# Patient Record
Sex: Female | Born: 1995 | Race: Black or African American | Hispanic: No | Smoking: Never smoker
Health system: Southern US, Community
[De-identification: ages and names within clinical notes are randomized; demographics above are authoritative.]

## PROBLEM LIST (undated history)

## (undated) DIAGNOSIS — D649 Anemia, unspecified: Secondary | ICD-10-CM

## (undated) DIAGNOSIS — Z789 Other specified health status: Secondary | ICD-10-CM

## (undated) DIAGNOSIS — F419 Anxiety disorder, unspecified: Secondary | ICD-10-CM

## (undated) HISTORY — DX: Anxiety disorder, unspecified: F41.9

## (undated) HISTORY — DX: Other specified health status: Z78.9

## (undated) HISTORY — DX: Anemia, unspecified: D64.9

## (undated) HISTORY — PX: NO PAST SURGERIES: SHX2092

---
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
## 2017-06-30 ENCOUNTER — Ambulatory Visit (INDEPENDENT_AMBULATORY_CARE_PROVIDER_SITE_OTHER): Payer: BLUE CROSS/BLUE SHIELD | Admitting: Family Medicine

## 2017-06-30 ENCOUNTER — Encounter: Payer: Self-pay | Admitting: Family Medicine

## 2017-06-30 VITALS — BP 110/70 | HR 77 | Resp 18 | Ht 66.0 in | Wt 108.0 lb

## 2017-06-30 DIAGNOSIS — Z113 Encounter for screening for infections with a predominantly sexual mode of transmission: Secondary | ICD-10-CM | POA: Diagnosis not present

## 2017-06-30 DIAGNOSIS — Z30011 Encounter for initial prescription of contraceptive pills: Secondary | ICD-10-CM

## 2017-06-30 DIAGNOSIS — Z01419 Encounter for gynecological examination (general) (routine) without abnormal findings: Secondary | ICD-10-CM

## 2017-06-30 HISTORY — DX: Encounter for initial prescription of contraceptive pills: Z30.011

## 2017-06-30 LAB — CBC WITH DIFFERENTIAL/PLATELET
BASOS ABS: 0 10*3/uL (ref 0.0–0.1)
Basophils Relative: 0.5 % (ref 0.0–3.0)
EOS PCT: 1.5 % (ref 0.0–5.0)
Eosinophils Absolute: 0 10*3/uL (ref 0.0–0.7)
HCT: 35.3 % — ABNORMAL LOW (ref 36.0–49.0)
HEMOGLOBIN: 11.9 g/dL — AB (ref 12.0–16.0)
LYMPHS ABS: 1.4 10*3/uL (ref 0.7–4.0)
LYMPHS PCT: 45.2 % (ref 24.0–48.0)
MCHC: 33.8 g/dL (ref 31.0–37.0)
MCV: 92.6 fl (ref 78.0–98.0)
MONOS PCT: 10.8 % (ref 3.0–12.0)
Monocytes Absolute: 0.3 10*3/uL (ref 0.1–1.0)
NEUTROS PCT: 42 % — AB (ref 43.0–71.0)
Neutro Abs: 1.3 10*3/uL — ABNORMAL LOW (ref 1.4–7.7)
Platelets: 182 10*3/uL (ref 150.0–575.0)
RBC: 3.82 Mil/uL (ref 3.80–5.70)
RDW: 12.8 % (ref 11.4–15.5)
WBC: 3 10*3/uL — ABNORMAL LOW (ref 4.5–13.5)

## 2017-06-30 LAB — COMPREHENSIVE METABOLIC PANEL
ALBUMIN: 4.3 g/dL (ref 3.5–5.2)
ALT: 14 U/L (ref 0–35)
AST: 20 U/L (ref 0–37)
Alkaline Phosphatase: 54 U/L (ref 47–119)
BUN: 8 mg/dL (ref 6–23)
CHLORIDE: 104 meq/L (ref 96–112)
CO2: 27 mEq/L (ref 19–32)
Calcium: 9.4 mg/dL (ref 8.4–10.5)
Creatinine, Ser: 0.66 mg/dL (ref 0.40–1.20)
GFR: 148.46 mL/min (ref >60.00)
Glucose, Bld: 88 mg/dL (ref 70–99)
POTASSIUM: 3.9 meq/L (ref 3.5–5.1)
SODIUM: 136 meq/L (ref 135–145)
Total Bilirubin: 0.5 mg/dL (ref 0.3–1.2)
Total Protein: 7.2 g/dL (ref 6.0–8.3)

## 2017-06-30 LAB — TSH: TSH: 1.47 u[IU]/mL (ref 0.40–5.00)

## 2017-06-30 LAB — LIPID PANEL
Cholesterol: 114 mg/dL (ref 0–200)
HDL: 51.8 mg/dL
LDL Cholesterol: 58 mg/dL (ref 0–99)
NonHDL: 62.64
Total CHOL/HDL Ratio: 2
Triglycerides: 23 mg/dL (ref 0.0–149.0)
VLDL: 4.6 mg/dL (ref 0.0–40.0)

## 2017-06-30 NOTE — Patient Instructions (Signed)
Great to see you.  We will call you with your lab results or you can view them online. 

## 2017-06-30 NOTE — Progress Notes (Signed)
Subjective:   Patient ID: Melissa Farmer, female    DOB: 09/03/1996, 21 y.o.   MRN: [id number]  Melissa Farmer is a pleasant 21 y.o. year old female who presents to clinic today with Annual Exam  on 06/30/2017  HPI:  Doing well.  Unfortunately she will need to transfer to a new school.  Her program in [place] is closing down.  She is working.  Sexually active with her boyfriend of 8 months.  Using condoms.  Does not want to restart depo- felt her periods were too irregular and often heavier with it.  Current Outpatient Prescriptions on File Prior to Visit  Medication Sig Dispense Refill  . ibuprofen (ADVIL,MOTRIN) 600 MG tablet Take 1 tablet (600 mg total) by mouth every 8 (eight) hours as needed. 30 tablet 0   No current facility-administered medications on file prior to visit.     No Known Allergies  No past medical history on file.  No past surgical history on file.  Family History  Problem Relation Age of Onset  . Asthma Father   . Kidney disease Maternal Grandmother   . Stroke Maternal Grandfather   . Diabetes Maternal Grandfather   . Heart disease Maternal Grandfather   . Cancer Paternal Grandmother     Social History   Social History  . Marital status: Single    Spouse name: N/A  . Number of children: N/A  . Years of education: N/A   Occupational History  . Not on file.   Social History Main Topics  . Smoking status: Never Smoker  . Smokeless tobacco: Never Used  . Alcohol use No  . Drug use: No  . Sexual activity: No   Other Topics Concern  . 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   The PMH, PSH, Social History, Family History, Medications, and allergies have been reviewed in [facility], and have been updated if relevant.   Review of Systems  Constitutional: Negative.   HENT: Negative.   Eyes: Negative.   Respiratory: Negative.   Cardiovascular: Negative.   Gastrointestinal:  Negative.   Endocrine: Negative.   Genitourinary: Negative.   Musculoskeletal: Negative.   Allergic/Immunologic: Negative.   Neurological: Negative.   Hematological: Negative.   Psychiatric/Behavioral: Negative.   All other systems reviewed and are negative.      Objective:    BP 110/70   Pulse 77   Resp 18   Ht 5\' 6"  (1.676 m)   Wt 108 lb (49 kg)   SpO2 99%   BMI 17.43 kg/m    Physical Exam   General:  Well-developed,well-nourished,in no acute distress; alert,appropriate and cooperative throughout examination Head:  normocephalic and atraumatic.   Eyes:  vision grossly intact, PERRL Ears:  R ear normal and L ear normal externally, TMs clear bilaterally Nose:  no external deformity.   Mouth:  good dentition.   Neck:  No deformities, masses, or tenderness noted. Breasts:  No mass, nodules, thickening, tenderness, bulging, retraction, inflamation, nipple discharge or skin changes noted.   Lungs:  Normal respiratory effort, chest expands symmetrically. Lungs are clear to auscultation, no crackles or wheezes. Heart:  Normal rate and regular rhythm. S1 and S2 normal without gallop, murmur, click, rub or other extra sounds. Abdomen:  Bowel sounds positive,abdomen soft and non-tender without masses, organomegaly or hernias noted. Msk:  No deformity or scoliosis noted of thoracic or lumbar spine.   Extremities:  No  clubbing, cyanosis, edema, or deformity noted with normal full range of motion of all joints.   Neurologic:  alert & oriented X3 and gait normal.   Skin:  Intact without suspicious lesions or rashes Cervical Nodes:  No lymphadenopathy noted Axillary Nodes:  No palpable lymphadenopathy Psych:  Cognition and judgment appear intact. Alert and cooperative with normal attention span and concentration. No apparent delusions, illusions, hallucinations       Assessment & Plan:   Well woman exam - Plan: CBC with Differential/Platelet, Comprehensive metabolic panel, Lipid  panel, TSH  Screening examination for STD (sexually transmitted disease) - Plan: HIV antibody (with reflex), RPR No Follow-up on file.

## 2017-06-30 NOTE — Assessment & Plan Note (Signed)
Reviewed preventive care protocols, scheduled due services, and updated immunizations Discussed nutrition, exercise, diet, and healthy lifestyle.  Orders Placed This Encounter  Procedures  . CBC with Differential/Platelet  . Comprehensive metabolic panel  . Lipid panel  . TSH  . HIV antibody (with reflex)  . RPR    

## 2017-07-01 ENCOUNTER — Telehealth: Payer: Self-pay | Admitting: Family Medicine

## 2017-07-01 DIAGNOSIS — R7989 Other specified abnormal findings of blood chemistry: Secondary | ICD-10-CM

## 2017-07-01 LAB — HIV ANTIBODY (ROUTINE TESTING W REFLEX): HIV 1&2 Ab, 4th Generation: NONREACTIVE

## 2017-07-01 LAB — RPR

## 2017-07-01 NOTE — Telephone Encounter (Signed)
Patient said her migraine headaches are worse.  She said she had a migraine headache yesterday and it lasted until she went to bed after work.  The migraine headache came back this morning. Patient wants to know what Dr.Aron suggests she do for the headaches.  Patient uses [facility].

## 2017-07-01 NOTE — Telephone Encounter (Signed)
Spoke with patient while she was driving & informed per Dr Dayton MartesAron: As long as she is not taking it all the time, she can take Alleve, Motrin, or excedrin migraine.  Always with food and never daily because that can lead to rebound/medication overuse headaches. I would also like to repeat her CBC in 3 months.

## 2017-07-01 NOTE — Telephone Encounter (Signed)
As long as she is not taking it all the time, she can take Alleve, Motrin, or excedrin migraine.  Always with food and never daily because that can lead to rebound/medication overuse headaches. I would also like to repeat her CBC in 3 months.

## 2017-07-01 NOTE — Telephone Encounter (Signed)
Patient returned Melissa Farmer's call about her lab results.  I let patient know her lab results.  Patient said she doesn't have heavy periods.

## 2018-02-02 ENCOUNTER — Encounter: Payer: Self-pay | Admitting: *Deleted

## 2018-02-08 ENCOUNTER — Ambulatory Visit: Payer: BLUE CROSS/BLUE SHIELD | Admitting: Family Medicine

## 2018-03-04 ENCOUNTER — Encounter: Payer: Self-pay | Admitting: Student

## 2018-03-04 ENCOUNTER — Ambulatory Visit (INDEPENDENT_AMBULATORY_CARE_PROVIDER_SITE_OTHER): Payer: BLUE CROSS/BLUE SHIELD | Admitting: Student

## 2018-03-04 VITALS — BP 122/74 | HR 90 | Wt 110.7 lb

## 2018-03-04 DIAGNOSIS — Z3400 Encounter for supervision of normal first pregnancy, unspecified trimester: Secondary | ICD-10-CM | POA: Insufficient documentation

## 2018-03-04 DIAGNOSIS — Z113 Encounter for screening for infections with a predominantly sexual mode of transmission: Secondary | ICD-10-CM | POA: Diagnosis not present

## 2018-03-04 DIAGNOSIS — Z3401 Encounter for supervision of normal first pregnancy, first trimester: Secondary | ICD-10-CM

## 2018-03-04 LAB — POCT URINALYSIS DIP (DEVICE)
Bilirubin Urine: NEGATIVE
GLUCOSE, UA: NEGATIVE mg/dL
HGB URINE DIPSTICK: NEGATIVE
Ketones, ur: NEGATIVE mg/dL
Leukocytes, UA: NEGATIVE
Nitrite: NEGATIVE
PROTEIN: NEGATIVE mg/dL
SPECIFIC GRAVITY, URINE: 1.02 (ref 1.005–1.030)
Urobilinogen, UA: 0.2 mg/dL (ref 0.0–1.0)
pH: 6.5 (ref 5.0–8.0)

## 2018-03-04 MED ORDER — PREPLUS 27-1 MG PO TABS: 1.0000 | ORAL_TABLET | Freq: Every day | ORAL | 12 refills | Status: DC

## 2018-03-04 NOTE — Progress Notes (Signed)
Here for initial prenatal visit. Given new patient packet. Has started sign up for MyChart. Signed up for Babys[employer]s optimization. Declined flu today, may get next visit.

## 2018-03-04 NOTE — Progress Notes (Signed)
  Subjective:    Melissa Farmer is being seen today for her first obstetrical visit.  This is not a planned pregnancy. She is at 4828w6d gestation. Her obstetrical history is significant for nothing. . Relationship with FOB: significant other, living together. Patient does intend to breast feed. Pregnancy history fully reviewed.  Patient reports no complaints.  Review of Systems:   Review of Systems  Constitutional: Negative.   HENT: Negative.   Respiratory: Negative.   Cardiovascular: Negative.   Gastrointestinal: Negative.   Genitourinary: Negative.   Musculoskeletal: Negative.   Neurological: Negative.   Hematological: Negative.   Psychiatric/Behavioral: Negative.     Objective:     BP 122/74   Pulse 90   Wt 110 lb 11.2 oz (50.2 kg)   LMP 12/18/2017 (Exact Date)   BMI 17.87 kg/m  Physical Exam  Constitutional: She appears well-developed.  HENT:  Head: Normocephalic.  Eyes: Pupils are equal, round, and reactive to light.  Neck: Normal range of motion.  Cardiovascular: Normal rate.  Respiratory: Effort normal.  GI: Soft.  Musculoskeletal: Normal range of motion.  Neurological: She is alert.  Skin: Skin is warm.  Psychiatric: She has a normal mood and affect.    Exam    Assessment:    Pregnancy: G1P0 Patient Active Problem List   Diagnosis Date Noted  . Supervision of normal first pregnancy, antepartum 03/04/2018  . Well woman exam 06/30/2017  . Bacterial vaginitis 06/27/2016  . Menometrorrhagia 06/27/2016  . Headache 03/26/2016  . Contraceptive management 10/31/2014       Plan:     Initial labs drawn. Prenatal vitamins. Problem list reviewed and updated. AFP3 discussed: will do NIPS. Marland Kitchen. Role of ultrasound in pregnancy discussed; fetal survey: ordered. Amniocentesis discussed: declined. Follow up in 8 weeks. 50% of 30 min visit spent on counseling and coordination of care.  -Patient doing well; no complaints.  -Welcomed and oriented patient  to practice -[place] [facility] -Pap not done (too young) -Return in 8 weeks   Charlesetta GaribaldiKathryn Lorraine [facility] 03/04/2018

## 2018-03-04 NOTE — Progress Notes (Signed)
PMH form completed

## 2018-03-04 NOTE — Patient Instructions (Signed)

## 2018-03-05 DIAGNOSIS — Z3400 Encounter for supervision of normal first pregnancy, unspecified trimester: Secondary | ICD-10-CM

## 2018-03-05 LAB — GC/CHLAMYDIA PROBE AMP (~~LOC~~) NOT AT ARMC
CHLAMYDIA, DNA PROBE: NEGATIVE
Neisseria Gonorrhea: NEGATIVE

## 2018-03-06 LAB — CULTURE, OB URINE

## 2018-03-06 LAB — URINE CULTURE, OB REFLEX

## 2018-03-08 ENCOUNTER — Encounter: Payer: Self-pay | Admitting: *Deleted

## 2018-03-11 ENCOUNTER — Encounter: Payer: Self-pay | Admitting: Student

## 2018-03-12 LAB — OBSTETRIC PANEL, INCLUDING HIV
Antibody Screen: NEGATIVE
BASOS ABS: 0 10*3/uL (ref 0.0–0.2)
Basos: 0 %
EOS (ABSOLUTE): 0 10*3/uL (ref 0.0–0.4)
Eos: 1 %
HIV SCREEN 4TH GENERATION: NONREACTIVE
Hematocrit: 33.6 % — ABNORMAL LOW (ref 34.0–46.6)
Hemoglobin: 11.4 g/dL (ref 11.1–15.9)
Hepatitis B Surface Ag: NEGATIVE
IMMATURE GRANULOCYTES: 1 %
Immature Grans (Abs): 0 10*3/uL (ref 0.0–0.1)
LYMPHS ABS: 1.2 10*3/uL (ref 0.7–3.1)
Lymphs: 21 %
MCH: 30.8 pg (ref 26.6–33.0)
MCHC: 33.9 g/dL (ref 31.5–35.7)
MCV: 91 fL (ref 79–97)
MONOS ABS: 0.5 10*3/uL (ref 0.1–0.9)
Monocytes: 9 %
NEUTROS ABS: 3.8 10*3/uL (ref 1.4–7.0)
NEUTROS PCT: 68 %
PLATELETS: 311 10*3/uL (ref 150–379)
RBC: 3.7 x10E6/uL — AB (ref 3.77–5.28)
RDW: 14.4 % (ref 12.3–15.4)
RPR: NONREACTIVE
Rh Factor: POSITIVE
Rubella Antibodies, IGG: 2.25 index (ref >0.99)
WBC: 5.5 10*3/uL (ref 3.4–10.8)

## 2018-03-12 LAB — SMN1 COPY NUMBER ANALYSIS (SMA CARRIER SCREENING)

## 2018-03-12 LAB — HEMOGLOBINOPATHY EVALUATION
Ferritin: 30 ng/mL (ref 15–77)
HGB A2 QUANT: 2.2 % (ref 1.8–3.2)
HGB A: 97.8 % (ref 96.4–98.8)
HGB C: 0 %
HGB VARIANT: 0 %
Hgb F Quant: 0 % (ref 0.0–2.0)
Hgb S: 0 %
Hgb Solubility: NEGATIVE

## 2018-03-18 ENCOUNTER — Encounter: Payer: Self-pay | Admitting: *Deleted

## 2018-03-19 ENCOUNTER — Encounter: Payer: BLUE CROSS/BLUE SHIELD | Admitting: Student

## 2018-03-24 ENCOUNTER — Encounter: Payer: Self-pay | Admitting: *Deleted

## 2018-04-09 ENCOUNTER — Telehealth: Payer: Self-pay | Admitting: General Practice

## 2018-04-09 NOTE — Telephone Encounter (Signed)
Received phone call from [facility] with an elevated BP. Patient's BP was 140/67 yesterday and patient reported headaches. Called patient and she states when she took her BP she had been running around all day and just walked in the door after carrying groceries in before she check her BP. Patient reports history of migraines but has noticed more headaches recently. Patient attributes this to wisdom teeth coming in. Patient denies blurry vision or dizziness. Patient states she has rechecked her BP today and it is 116/66. Reviewed with Dr Debroah Loop, who states BP is fine for now, will continue to monitor. Patient's next OB visit is 6/10. Discussed with patient and reviewed proper way to check BP. Patient verbalized understanding & had no questions.

## 2018-04-11 ENCOUNTER — Encounter: Payer: Self-pay | Admitting: Student

## 2018-04-14 ENCOUNTER — Encounter: Payer: Self-pay | Admitting: Student

## 2018-04-21 ENCOUNTER — Telehealth: Payer: Self-pay | Admitting: Licensed Clinical Social Worker

## 2018-04-21 ENCOUNTER — Encounter (HOSPITAL_COMMUNITY): Payer: Self-pay

## 2018-04-21 NOTE — Telephone Encounter (Signed)
CSW A. Aliviana Burdell contacted pt regarding scheduled visit. Pt confirmed.  

## 2018-04-26 ENCOUNTER — Ambulatory Visit (INDEPENDENT_AMBULATORY_CARE_PROVIDER_SITE_OTHER): Payer: BLUE CROSS/BLUE SHIELD | Admitting: Advanced Practice Midwife

## 2018-04-26 VITALS — BP 116/70 | HR 97 | Wt 113.8 lb

## 2018-04-26 DIAGNOSIS — Z3402 Encounter for supervision of normal first pregnancy, second trimester: Secondary | ICD-10-CM

## 2018-04-26 NOTE — Progress Notes (Signed)
   PRENATAL VISIT NOTE  Subjective:  Melissa Farmer is a 22 y.o. G1P0 at 859w3d being seen today for ongoing prenatal care.  She is currently monitored for the following issues for this low-risk pregnancy and has Contraceptive management; Headache; Bacterial vaginitis; Menometrorrhagia; Well woman exam; and Supervision of normal first pregnancy, antepartum on their problem list.  Patient reports no complaints.  Contractions: Not present. Vag. Bleeding: None.  Movement: Present. Denies leaking of fluid.   The following portions of the patient's history were reviewed and updated as appropriate: allergies, current medications, past family history, past medical history, past social history, past surgical history and problem list. Problem list updated.  Objective:   Vitals:   04/26/18 1035  BP: 116/70  Pulse: 97  Weight: 113 lb 12.8 oz (51.6 kg)    Fetal Status: Fetal Heart Rate (bpm): 152   Movement: Present     General:  Alert, oriented and cooperative. Patient is in no acute distress.  Skin: Skin is warm and dry. No rash noted.   Cardiovascular: Normal heart rate noted  Respiratory: Normal respiratory effort, no problems with respiration noted  Abdomen: Soft, gravid, appropriate for gestational age.  Pain/Pressure: Absent     Pelvic: Cervical exam deferred        Extremities: Normal range of motion.  Edema: None  Mental Status: Normal mood and affect. Normal behavior. Normal judgment and thought content.   Assessment and Plan:  Pregnancy: G1P0 at 5659w3d  1. Encounter for supervision of normal first pregnancy in second trimester --Excited to hear heartbeat today, feeling well --MSAFP today --Next visit in about 9 weeks, fasting for two hour GTT   Preterm labor symptoms and general obstetric precautions including but not limited to vaginal bleeding, contractions, leaking of fluid and fetal movement were reviewed in detail with the patient. Please refer to After Visit Summary  for other counseling recommendations.  Return in about 2 months (around 06/28/2018).  Future Appointments  Date Time Provider Department Center  04/30/2018 12:45 PM [facility] US 2 [facility]    Calvert CantorSamantha C Welford Christmas, CNM  04/26/18  10:50 AM

## 2018-04-26 NOTE — Patient Instructions (Signed)
Second Trimester of Pregnancy The second trimester is from week 13 through week 28, month 4 through 6. This is often the time in pregnancy that you feel your best. Often times, morning sickness has lessened or quit. You may have more energy, and you may get hungry more often. Your unborn baby (fetus) is growing rapidly. At the end of the sixth month, he or she is about 9 inches long and weighs about 1 pounds. You will likely feel the baby move (quickening) between 18 and 20 weeks of pregnancy. Follow these instructions at home:  Avoid all smoking, herbs, and alcohol. Avoid drugs not approved by your doctor.  Do not use any tobacco products, including cigarettes, chewing tobacco, and electronic cigarettes. If you need help quitting, ask your doctor. You may get counseling or other support to help you quit.  Only take medicine as told by your doctor. Some medicines are safe and some are not during pregnancy.  Exercise only as told by your doctor. Stop exercising if you start having cramps.  Eat regular, healthy meals.  Wear a good support bra if your breasts are tender.  Do not use hot tubs, steam rooms, or saunas.  Wear your seat belt when driving.  Avoid raw meat, uncooked cheese, and liter boxes and soil used by cats.  Take your prenatal vitamins.  Take 1500-2000 milligrams of calcium daily starting at the 20th week of pregnancy until you deliver your baby.  Try taking medicine that helps you poop (stool softener) as needed, and if your doctor approves. Eat more fiber by eating fresh fruit, vegetables, and whole grains. Drink enough fluids to keep your pee (urine) clear or pale yellow.  Take warm water baths (sitz baths) to soothe pain or discomfort caused by hemorrhoids. Use hemorrhoid cream if your doctor approves.  If you have puffy, bulging veins (varicose veins), wear support hose. Raise (elevate) your feet for 15 minutes, 3-4 times a day. Limit salt in your diet.  Avoid heavy  lifting, wear low heals, and sit up straight.  Rest with your legs raised if you have leg cramps or low back pain.  Visit your dentist if you have not gone during your pregnancy. Use a soft toothbrush to brush your teeth. Be gentle when you floss.  You can have sex (intercourse) unless your doctor tells you not to.  Go to your doctor visits. Get help if:  You feel dizzy.  You have mild cramps or pressure in your lower belly (abdomen).  You have a nagging pain in your belly area.  You continue to feel sick to your stomach (nauseous), throw up (vomit), or have watery poop (diarrhea).  You have bad smelling fluid coming from your vagina.  You have pain with peeing (urination). Get help right away if:  You have a fever.  You are leaking fluid from your vagina.  You have spotting or bleeding from your vagina.  You have severe belly cramping or pain.  You lose or gain weight rapidly.  You have trouble catching your breath and have chest pain.  You notice sudden or extreme puffiness (swelling) of your face, hands, ankles, feet, or legs.  You have not felt the baby move in over an hour.  You have severe headaches that do not go away with medicine.  You have vision changes. This information is not intended to replace advice given to you by your health care provider. Make sure you discuss any questions you have with your health care   provider. Document Released: 01/28/2010 Document Revised: 04/10/2016 Document Reviewed: 01/04/2013 Elsevier Interactive Patient Education  2017 [employer] Inc.  

## 2018-04-28 LAB — AFP, SERUM, OPEN SPINA BIFIDA
AFP MOM: 0.61
AFP Value: 36.1 ng/mL
Gest. Age on Collection Date: 18.3 weeks
Maternal Age At EDD: 20.1 yr
OSBR RISK 1 IN: 10000
TEST RESULTS AFP: NEGATIVE
WEIGHT: 113 [lb_av]

## 2018-04-30 ENCOUNTER — Ambulatory Visit (HOSPITAL_COMMUNITY)
Admission: RE | Admit: 2018-04-30 | Discharge: 2018-04-30 | Disposition: A | Discharge status: Home / Self Care | Payer: BLUE CROSS/BLUE SHIELD | Source: Ambulatory Visit | Attending: Student | Admitting: Student

## 2018-04-30 DIAGNOSIS — Z363 Encounter for antenatal screening for malformations: Secondary | ICD-10-CM | POA: Insufficient documentation

## 2018-04-30 DIAGNOSIS — Z3402 Encounter for supervision of normal first pregnancy, second trimester: Secondary | ICD-10-CM | POA: Insufficient documentation

## 2018-04-30 DIAGNOSIS — Z3400 Encounter for supervision of normal first pregnancy, unspecified trimester: Secondary | ICD-10-CM

## 2018-05-04 ENCOUNTER — Encounter: Payer: Self-pay | Admitting: Student

## 2018-05-04 DIAGNOSIS — O43112 Circumvallate placenta, second trimester: Secondary | ICD-10-CM | POA: Insufficient documentation

## 2018-05-08 ENCOUNTER — Encounter: Payer: Self-pay | Admitting: Student

## 2018-05-10 ENCOUNTER — Encounter: Payer: Self-pay | Admitting: Student

## 2018-05-13 ENCOUNTER — Encounter: Payer: Self-pay | Admitting: Student

## 2018-06-02 ENCOUNTER — Encounter: Payer: Self-pay | Admitting: Student

## 2018-06-08 ENCOUNTER — Encounter: Payer: Self-pay | Admitting: Advanced Practice Midwife

## 2018-06-14 ENCOUNTER — Encounter (HOSPITAL_COMMUNITY): Payer: Self-pay | Admitting: Emergency Medicine

## 2018-06-14 ENCOUNTER — Ambulatory Visit (HOSPITAL_COMMUNITY)
Admission: EM | Admit: 2018-06-14 | Discharge: 2018-06-14 | Disposition: A | Discharge status: Home / Self Care | Payer: BLUE CROSS/BLUE SHIELD | Attending: Family Medicine | Admitting: Family Medicine

## 2018-06-14 ENCOUNTER — Encounter: Payer: Self-pay | Admitting: Student

## 2018-06-14 DIAGNOSIS — R0789 Other chest pain: Secondary | ICD-10-CM | POA: Diagnosis not present

## 2018-06-14 NOTE — Discharge Instructions (Signed)
Tylenol for pain Rest tonight Return if worse

## 2018-06-14 NOTE — ED Provider Notes (Signed)
[facility]    CSN: [id number] Arrival date & time: 06/14/18  1655     History   Chief Complaint No chief complaint on file.   HPI Melissa Farmer is a 22 y.o. female.   HPI  Patient states that today only she is been experiencing chest pain.  She states it feels like a "pressure" in the center of her chest.  It comes and goes.  It is worse when she is standing and better when she sitting.  She also has some achiness and tingling in both arms.  No history of heart disease, hypertension, diabetes.  She is currently expecting, pregnancy is going well.  No leg pain, no history of clots.  No history of asthma or underlying lung disease.  No history of underlying heart disease or murmur.  No fever chills or recent illness, no cough.  No change in activity or heavy lifting.  History reviewed. No pertinent past medical history.  Patient Active Problem List   Diagnosis Date Noted  . 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 Supervision of normal first pregnancy, antepartum 03/04/2018  . Well woman exam 06/30/2017  . Bacterial vaginitis 06/27/2016  . Menometrorrhagia 06/27/2016  . Headache 03/26/2016  . Contraceptive management 10/31/2014    History reviewed. No pertinent surgical history.  OB History    Gravida  1   Para      Term      Preterm      AB      Living        SAB      TAB      Ectopic      Multiple      Live Births               Home Medications    Prior to Admission medications   Medication Sig Start Date End Date Taking? Authorizing Provider  Prenatal Vit-Fe Fumarate-FA (PREPLUS) 27-1 MG TABS Take 1 tablet by mouth daily. 03/04/18  Yes Marylene LandKooistra, Kathryn Lorraine, CNM  ibuprofen (ADVIL,MOTRIN) 600 MG tablet Take 1 tablet (600 mg total) by mouth every 8 (eight) hours as needed. 03/26/16   Dianne DunAron, Talia M, MD    Family History Family History  Problem Relation Age of Onset  . Kidney  disease Maternal Grandmother   . Stroke Maternal Grandfather   . Diabetes Maternal Grandfather   . Heart disease Maternal Grandfather   . Cancer Paternal Grandmother   . Asthma Father     Social History Social History   Tobacco Use  . Smoking status: Never Smoker  . Smokeless tobacco: Never Used  Substance Use Topics  . Alcohol use: No    Alcohol/week: 0.0 oz  . Drug use: No     Allergies   Patient has no known allergies.   Review of Systems Review of Systems  Constitutional: Negative for chills and fever.  HENT: Negative for ear pain and sore throat.   Eyes: Negative for pain and visual disturbance.  Respiratory: Positive for chest tightness. Negative for cough and shortness of breath.   Cardiovascular: Negative for chest pain and palpitations.  Gastrointestinal: Negative for abdominal pain and vomiting.  Genitourinary: Negative for dysuria and hematuria.  Musculoskeletal: Negative for arthralgias and back pain.  Skin: Negative for color change and rash.  Neurological: Positive for weakness and numbness. Negative for seizures and syncope.  All other systems reviewed and are negative.    Physical Exam Triage Vital Signs  ED Triage Vitals  Enc Vitals Group     BP 06/14/18 1749 123/79     Pulse Rate 06/14/18 1749 85     Resp 06/14/18 1749 16     Temp 06/14/18 1749 98.1 F (36.7 C)     Temp Source 06/14/18 1749 Oral     SpO2 06/14/18 1749 100 %     Weight 06/14/18 1750 125 lb (56.7 kg)     Height --      Head Circumference --      Peak Flow --      Pain Score 06/14/18 1750 6     Pain Loc --      Pain Edu? --      Excl. in GC? --    No data found.  Updated Vital Signs BP 123/79   Pulse 85   Temp 98.1 F (36.7 C) (Oral)   Resp 16   Wt 125 lb (56.7 kg)   LMP 12/18/2017 (Exact Date)   SpO2 100%   BMI 20.18 kg/m   Visual Acuity Right Eye Distance:   Left Eye Distance:   Bilateral Distance:    Right Eye Near:   Left Eye Near:    Bilateral  Near:     Physical Exam  Constitutional: She appears well-developed and well-nourished. No distress.  HENT:  Head: Normocephalic and atraumatic.  Right Ear: External ear normal.  Left Ear: External ear normal.  Mouth/Throat: Oropharynx is clear and moist.  Eyes: Pupils are equal, round, and reactive to light. Conjunctivae are normal.  Neck: Normal range of motion.  Cardiovascular: Normal rate and regular rhythm.  Murmur heard. Pulmonary/Chest: Effort normal and breath sounds normal. No respiratory distress.  Abdominal: Soft. She exhibits no distension.  Musculoskeletal: Normal range of motion. She exhibits no edema.  Lymphadenopathy:    She has cervical adenopathy.  Neurological: She is alert.  Skin: Skin is warm and dry.  Psychiatric: She has a normal mood and affect. Her behavior is normal.   No chest wall tenderness to palpation.  No abdominal tenderness to palpation.  UC Treatments / Results  Labs (all labs ordered are listed, but only abnormal results are displayed) Labs Reviewed - No data to display  EKG EKG is normal.  Normal sinus rhythm.  No ST changes. None  Radiology No results found.  Procedures Procedures (including critical care time)  Medications Ordered in UC Medications - No data to display  Initial Impression / Assessment and Plan / UC Course  I have reviewed the triage vital signs and the nursing notes.  Pertinent labs & imaging results that were available during my care of the patient were reviewed by me and considered in my medical decision making (see chart for details).     Discussed with patient with a normal EKG, normal physical examination I feel she can go home and take some Tylenol.  She gets worsening chest pain, shortness of breath, or fever she needs to come back or go to the emergency room.  Follow-up with OB as scheduled Final Clinical Impressions(s) / UC Diagnoses   Final diagnoses:  Feeling of chest tightness     Discharge  Instructions     Tylenol for pain Rest tonight Return if worse    ED Prescriptions    None     Controlled Substance Prescriptions [place] Controlled Substance Registry consulted? Not Applicable   [name], MD 06/14/18 2002

## 2018-06-14 NOTE — ED Triage Notes (Addendum)
PT reports intermittent chest pressure today. No medical history. PT is 5 months pregnant.

## 2018-06-23 ENCOUNTER — Other Ambulatory Visit: Payer: Self-pay | Admitting: *Deleted

## 2018-06-23 DIAGNOSIS — Z3400 Encounter for supervision of normal first pregnancy, unspecified trimester: Secondary | ICD-10-CM

## 2018-06-28 ENCOUNTER — Ambulatory Visit (INDEPENDENT_AMBULATORY_CARE_PROVIDER_SITE_OTHER): Payer: BLUE CROSS/BLUE SHIELD | Admitting: Advanced Practice Midwife

## 2018-06-28 ENCOUNTER — Other Ambulatory Visit: Payer: BLUE CROSS/BLUE SHIELD

## 2018-06-28 ENCOUNTER — Encounter: Payer: Self-pay | Admitting: Advanced Practice Midwife

## 2018-06-28 VITALS — BP 133/81 | HR 79 | Wt 129.0 lb

## 2018-06-28 DIAGNOSIS — Z3402 Encounter for supervision of normal first pregnancy, second trimester: Secondary | ICD-10-CM

## 2018-06-28 DIAGNOSIS — Z3400 Encounter for supervision of normal first pregnancy, unspecified trimester: Secondary | ICD-10-CM

## 2018-06-28 DIAGNOSIS — Z23 Encounter for immunization: Secondary | ICD-10-CM

## 2018-06-28 NOTE — Patient Instructions (Addendum)
Considering Waterbirth? Guide for patients at [facility]  Why consider waterbirth?  . Gentle birth for babies . Less pain medicine used in labor . May allow for passive descent/less pushing . May reduce perineal tears  . More mobility and instinctive maternal position changes . Increased maternal relaxation . Reduced blood pressure in labor  Is waterbirth safe? What are the risks of infection, drowning or other complications?  . Infection: o Very low risk (3.7 % for tub vs 4.8% for bed) o 7 in 8000 waterbirths with documented infection o Poorly cleaned equipment most common cause o Slightly lower group B strep transmission rate  . Drowning o Maternal:  - Very low risk   - Related to seizures or fainting o Newborn:  - Very low risk. No evidence of increased risk of respiratory problems in multiple large studies - Physiological protection from breathing under water - Avoid underwater birth if there are any fetal complications - Once baby's head is out of the water, keep it out.  . Birth complication o Some reports of cord trauma, but risk decreased by bringing baby to surface gradually o No evidence of increased risk of shoulder dystocia. Mothers can usually change positions faster in water than in a bed, possibly aiding the maneuvers to free the shoulder.   You must attend a Waterbirth class at [facility]  3rd Wednesday of every month from 7-9pm  Free  Register by calling [phone] or online at [email or web address]  Bring us the certificate from the class to your prenatal appointment  Meet with a midwife at 36 weeks to see if you can still plan a waterbirth and to sign the consent.   Purchase or rent the following supplies:   Water Birth Pool (Birth Pool in a Box or [place] for instance)  (Tubs start ~$125)  Single-use disposable tub liner designed for your brand of tub  New garden hose labeled "lead-free", "suitable for drinking  water",  Electric drain pump to remove water (We recommend 792 gallon per hour or greater pump.)   Separate garden hose to remove the dirty water  Fish net  Bathing suit top (optional)  Long-handled mirror (optional)  Places to purchase or rent supplies  Yourwaterbirth.com for tub purchases and supplies  Waterbirthsolutions.com for tub purchases and supplies  [employer] (www.thelaborladies.com) $275 for tub rental/set-up & take down/kit   [place] [employer] (http://www.padanc.org/MeetUs.htm) Information regarding doulas (labor support) who provide pool rentals  Our practice has a Birth Pool in a Box tub at the hospital that you may borrow on a first-come-first-served basis. It is your responsibility to to set up, clean and break down the tub. We cannot guarantee the availability of this tub in advance. You are responsible for bringing all accessories listed above. If you do not have all necessary supplies you cannot have a waterbirth.    Things that would prevent you from having a waterbirth:  Premature, <37wks  Previous cesarean birth  Presence of thick meconium-stained fluid  Multiple gestation (Twins, triplets, etc.)  Uncontrolled diabetes or gestational diabetes requiring medication  Hypertension requiring medication or diagnosis of pre-eclampsia  Heavy vaginal bleeding  Non-reassuring fetal heart rate  Active infection (MRSA, etc.). Group B Strep is NOT a contraindication for  waterbirth.  If your labor has to be induced and induction method requires continuous  monitoring of the baby's heart rate  Other risks/issues identified by your obstetrical provider  Please remember that birth is unpredictable. Under certain unforeseeable   circumstances your provider may advise against giving birth in the tub. These decisions will be made on a case-by-case basis and with the safety of you and your baby as our highest priority.    

## 2018-06-28 NOTE — Progress Notes (Signed)
   PRENATAL VISIT NOTE  Subjective:  Melissa Farmer is a 22 y.o. G1P0 at 3480w3d being seen today for ongoing prenatal care.  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  Patient reports no complaints.  Contractions: Not present. Vag. Bleeding: None.  Movement: Present. Denies leaking of fluid.   The following portions of the patient's history were reviewed and updated as appropriate: allergies, current medications, past family history, past medical history, past social history, past surgical history and problem list. Problem list updated.  Objective:   Vitals:   06/28/18 0908  BP: 133/81  Pulse: 79  Weight: 129 lb (58.5 kg)    Fetal Status: Fetal Heart Rate (bpm): 148 Fundal Height: 28 cm Movement: Present     General:  Alert, oriented and cooperative. Patient is in no acute distress.  Skin: Skin is warm and dry. No rash noted.   Cardiovascular: Normal heart rate noted  Respiratory: Normal respiratory effort, no problems with respiration noted  Abdomen: Soft, gravid, appropriate for gestational age.  Pain/Pressure: Absent     Pelvic: Cervical exam deferred        Extremities: Normal range of motion.  Edema: None  Mental Status: Normal mood and affect. Normal behavior. Normal judgment and thought content.   Assessment and Plan:  Pregnancy: G1P0 at 7280w3d  1. Supervision of normal first pregnancy, antepartum - GTT and 28 week labs today - Waterbirth consent given to patient. She will read it and plan to return it at her next visit.   Preterm labor symptoms and general obstetric precautions including but not limited to vaginal bleeding, contractions, leaking of fluid and fetal movement were reviewed in detail with the patient. Please refer to After Visit Summary for other  counseling recommendations.  Return in about 4 weeks (around 07/26/2018).  Future Appointments  Date Time Provider Department Center  07/26/2018  8:55 AM Armando ReichertHogan, Heather D, CNM [facility]    Thressa ShellerHeather Hogan, CNM

## 2018-06-29 LAB — CBC
Hematocrit: 35.3 % (ref 34.0–46.6)
Hemoglobin: 11.4 g/dL (ref 11.1–15.9)
MCH: 31.3 pg (ref 26.6–33.0)
MCHC: 32.3 g/dL (ref 31.5–35.7)
MCV: 97 fL (ref 79–97)
Platelets: 178 x10E3/uL (ref 150–450)
RBC: 3.64 x10E6/uL — ABNORMAL LOW (ref 3.77–5.28)
RDW: 13.7 % (ref 12.3–15.4)
WBC: 7.9 x10E3/uL (ref 3.4–10.8)

## 2018-06-29 LAB — GLUCOSE TOLERANCE, 2 HOURS W/ 1HR
GLUCOSE, 2 HOUR: 104 mg/dL (ref 65–152)
Glucose, 1 hour: 142 mg/dL (ref 65–179)
Glucose, Fasting: 78 mg/dL (ref 65–91)

## 2018-06-29 LAB — HIV ANTIBODY (ROUTINE TESTING W REFLEX): HIV Screen 4th Generation wRfx: NONREACTIVE

## 2018-06-29 LAB — SYPHILIS: RPR W/REFLEX TO RPR TITER AND TREPONEMAL ANTIBODIES, TRADITIONAL SCREENING AND DIAGNOSIS ALGORITHM: RPR Ser Ql: NONREACTIVE

## 2018-07-26 ENCOUNTER — Encounter: Payer: BLUE CROSS/BLUE SHIELD | Admitting: Advanced Practice Midwife

## 2018-07-27 ENCOUNTER — Ambulatory Visit (INDEPENDENT_AMBULATORY_CARE_PROVIDER_SITE_OTHER): Payer: BLUE CROSS/BLUE SHIELD | Admitting: Obstetrics and Gynecology

## 2018-07-27 DIAGNOSIS — Z3400 Encounter for supervision of normal first pregnancy, unspecified trimester: Secondary | ICD-10-CM

## 2018-07-27 DIAGNOSIS — Z23 Encounter for immunization: Secondary | ICD-10-CM

## 2018-07-27 DIAGNOSIS — Z3403 Encounter for supervision of normal first pregnancy, third trimester: Secondary | ICD-10-CM

## 2018-07-27 NOTE — Patient Instructions (Signed)
AREA PEDIATRIC/FAMILY PRACTICE PHYSICIANS  [facility] [address], [address] [place], [place]  [address] Phone - [phone]   Fax - [phone]  [facility] [address] [place], [place] [address] Phone - [phone]   Fax - [phone]  [facility] [address] [place], [place]  [address] Phone - [phone]   Fax - [phone]  [facility] [address] [place], [place]  [address] Phone - [phone]   Fax - [phone]  [facility] [address] [place], [place]  [address] Phone - [phone]   Fax - [phone]  [facility] [address] [place], [place]  [address] Phone - [phone]   Fax - [phone]  [facility] [address] [place], [place]  [address] Phone - [phone]   Fax - [phone]  [facility] [address] [place], [place]  [address] Phone - [phone]   Fax - [phone]  [facility] [address] [place], [place]  [address] Phone - [phone]   Fax - [phone] [facility] [address] [place], [place]  [address] Phone - [phone]   Fax - [phone]  [facility] [address] [place], [place]  [address] Phone - [phone]   Fax - [phone]  [facility] [address] [place], [place]  [address] Phone - [phone]   Fax - [phone]  [facility] [address] [place], [place]  [address] Phone - [phone]   Fax - [phone]  [facility] [address] [place], [place]  [address] Phone - [phone]  [facility] [address] [place], [place]  [address] Phone - [phone]   Fax - [phone]  [facility] [address] [place], [place]  [address] Phone - [phone]   Fax - [phone]  [facility] [address] [place], [place]  [address] Phone - [phone]   Fax - [phone]  [facility] [address] [place], [place]  [address] Phone - [phone]   Fax - [phone]   [facility] [address] [place], [place]  [address] Phone - [phone]   Fax - [phone]  [facility] [address] [place], [place]  [address] Phone - [phone]   Fax - [phone]  [facility] [address] , [place]  [address] Phone - [phone]   Fax - [phone]  [facility] [address] , [place]  [address] Phone - [phone]   Fax - [phone]  [facility] [address] , [place]  [address] Phone - [phone]   Fax - [phone] [facility] [address] [place], [place]  [address] Phone - [phone]   Fax - [phone]  [facility] [address] [place], [place]  [address] Phone - [phone]   Fax - [phone]  [facility] [address] [place],   [address] Phone - [phone]   Fax - [phone]  [facility] Charlene Flemming MD [address] [place]  [address] Phone [phone]  Fax [phone]    Contraception Choices Contraception, also called birth control, means things to use or ways   to try not to get pregnant. Hormonal birth control This kind of birth control uses hormones. Here are some types of hormonal birth control:  A tube that is put under skin of the arm (implant). The tube can stay in for as long as 3 years.  Shots to get every 3 months (injections).  Pills to take every day (birth control pills).  A patch to change 1 time each week for 3 weeks (birth control  patch). After that, the patch is taken off for 1 week.  A ring to put in the vagina. The ring is left in for 3 weeks. Then it is taken out of the vagina for 1 week. Then a new ring is put in.  Pills to take after unprotected sex (emergency birth control pills).  Barrier birth control Here are some types of barrier birth control:  A thin covering that is put on the penis before sex (female condom). The covering is thrown away after sex.  A soft, loose covering that is put in the vagina before sex (female condom). The covering is thrown away after sex.  A rubber bowl that sits over the cervix (diaphragm). The bowl must be made for you. The bowl is put into the vagina before sex. The bowl is left in for 6-8 hours after sex. It is taken out within 24 hours.  A small, soft cup that fits over the cervix (cervical cap). The cup must be made for you. The cup can be left in for 6-8 hours after sex. It is taken out within 48 hours.  A sponge that is put into the vagina before sex. It must be left in for at least 6 hours after sex. It must be taken out within 30 hours. Then it is thrown away.  A chemical that kills or stops sperm from getting into the uterus (spermicide). It may be a pill, cream, jelly, or foam to put in the vagina. The chemical should be used at least 10-15 minutes before sex.  IUD (intrauterine) birth control An IUD is a small, T-shaped piece of plastic. It is put inside the uterus. There are two kinds:  Hormone IUD. This kind can stay in for 3-5 years.  Copper IUD. This kind can stay in for 10 years.  Permanent birth control Here are some types of permanent birth control:  Surgery to block the fallopian tubes.  Having an insert put into each fallopian tube.  Surgery to tie off the tubes that carry sperm (vasectomy).  Natural planning birth control Here are some types of natural planning birth control:  Not having sex on the days the woman could get pregnant.  Using a  calendar: ? To keep track of the length of each period. ? To find out what days pregnancy can happen. ? To plan to not have sex on days when pregnancy can happen.  Watching for symptoms of ovulation and not having sex during ovulation. One way the woman can check for ovulation is to check her temperature.  Waiting to have sex until after ovulation.  Summary  Contraception, also called birth control, means things to use or ways to try not to get pregnant.  Hormonal methods of birth control include implants, injections, pills, patches, vaginal rings, and emergency birth control pills.  Barrier methods of birth control can include female condoms, female condoms, diaphragms, cervical caps, sponges, and spermicides.  There are two types of IUD (intrauterine device) birth control. An IUD can be put in a   woman's uterus to prevent pregnancy for 3-5 years.  Permanent sterilization can be done through a procedure for males, females, or both.  Natural planning methods involve not having sex on the days when the woman could get pregnant. This information is not intended to replace advice given to you by your health care provider. Make sure you discuss any questions you have with your health care provider. Document Released: 08/31/2009 Document Revised: 11/13/2016 Document Reviewed: 11/13/2016 Elsevier Interactive Patient Education  2017 Elsevier Inc.  

## 2018-07-27 NOTE — Progress Notes (Signed)
   PRENATAL VISIT NOTE  Subjective:  Melissa Farmer is a 22 y.o. G1P0 at [redacted]w[redacted]d being seen today for ongoing prenatal care.  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  Patient reports no complaints.  Contractions: Not present. Vag. Bleeding: None.  Movement: Present. Denies leaking of fluid.   The following portions of the patient's history were reviewed and updated as appropriate: allergies, current medications, past family history, past medical history, past social history, past surgical history and problem list. Problem list updated.  Objective:   Vitals:   07/27/18 0920  BP: 119/71  Pulse: 86  Weight: 136 lb 3.2 oz (61.8 kg)    Fetal Status: Fetal Heart Rate (bpm): 154 Fundal Height: 30 cm Movement: Present     General:  Alert, oriented and cooperative. Patient is in no acute distress.  Skin: Skin is warm and dry. No rash noted.   Cardiovascular: Normal heart rate noted  Respiratory: Normal respiratory effort, no problems with respiration noted  Abdomen: Soft, gravid, appropriate for gestational age.  Pain/Pressure: Absent     Pelvic: Cervical exam deferred        Extremities: Normal range of motion.  Edema: Trace  Mental Status: Normal mood and affect. Normal behavior. Normal judgment and thought content.   Assessment and Plan:  Pregnancy: G1P0 at [redacted]w[redacted]d  1. Supervision of normal first pregnancy, antepartum - Flu Vaccine QUAD 36+ mos IM (Fluarix, Quad PF)  There are no diagnoses linked to this encounter. Preterm labor symptoms and general obstetric precautions including but not limited to vaginal bleeding, contractions, leaking of fluid and fetal movement were reviewed in detail with the patient. Please refer to After Visit Summary for other counseling  recommendations.  Return in about 2 weeks (around 08/10/2018).  No future appointments.  Venia Carbon, NP

## 2018-08-10 ENCOUNTER — Ambulatory Visit (INDEPENDENT_AMBULATORY_CARE_PROVIDER_SITE_OTHER): Payer: BLUE CROSS/BLUE SHIELD | Admitting: Student

## 2018-08-10 DIAGNOSIS — Z3400 Encounter for supervision of normal first pregnancy, unspecified trimester: Secondary | ICD-10-CM

## 2018-08-10 NOTE — Patient Instructions (Signed)
Group B Streptococcus Infection During Pregnancy Group B Streptococcus (GBS) is a type of bacteria (Streptococcus agalactiae) that is often found in healthy people, commonly in the rectum, vagina, and intestines. In people who are healthy and not pregnant, the bacteria rarely cause serious illness or complications. However, women who test positive for GBS during pregnancy can pass the bacteria to their baby during childbirth, which can cause serious infection in the baby after birth. Women with GBS may also have infections during their pregnancy or immediately after childbirth, such as such as urinary tract infections (UTIs) or infections of the uterus (uterine infections). Having GBS also increases a woman's risk of complications during pregnancy, such as early (preterm) labor or delivery, miscarriage, or stillbirth. Routine testing (screening) for GBS is recommended for all pregnant women. What increases the risk? You may have a higher risk for GBS infection during pregnancy if you had one during a past pregnancy. What are the signs or symptoms? In most cases, GBS infection does not cause symptoms in pregnant women. Signs and symptoms of a possible GBS-related infection may include:  Labor starting before the 37th week of pregnancy.  A UTI or bladder infection, which may cause: ? Fever. ? Pain or burning during urination. ? Frequent urination.  Fever during labor, along with: ? Bad-smelling discharge. ? Uterine tenderness. ? Rapid heartbeat in the mother, baby, or both.  Rare but serious symptoms of a possible GBS-related infection in women include:  Blood infection (septicemia). This may cause fever, chills, or confusion.  Lung infection (pneumonia). This may cause fever, chills, cough, rapid breathing, difficulty breathing, or chest pain.  Bone, joint, skin, or soft tissue infection.  How is this diagnosed? You may be screened for GBS between week 35 and week 37 of your pregnancy. If  you have symptoms of preterm labor, you may be screened earlier. This condition is diagnosed based on lab test results from:  A swab of fluid from the vagina and rectum.  A urine sample.  How is this treated? This condition is treated with antibiotic medicine. When you go into labor, or as soon as your water breaks (your membranes rupture), you will be given antibiotics through an IV tube. Antibiotics will continue until after you give birth. If you are having a cesarean delivery, you do not need antibiotics unless your membranes have already ruptured. Follow these instructions at home:  Take over-the-counter and prescription medicines only as told by your health care provider.  Take your antibiotic medicine as told by your health care provider. Do not stop taking the antibiotic even if you start to feel better.  Keep all pre-birth (prenatal) visits and follow-up visits as told by your health care provider. This is important. Contact a health care provider if:  You have pain or burning when you urinate.  You have to urinate frequently.  You have a fever or chills.  You develop a bad-smelling vaginal discharge. Get help right away if:  Your membranes rupture.  You go into labor.  You have severe pain in your abdomen.  You have difficulty breathing.  You have chest pain. This information is not intended to replace advice given to you by your health care provider. Make sure you discuss any questions you have with your health care provider. Document Released: 02/10/2008 Document Revised: 05/30/2016 Document Reviewed: 05/29/2016 Elsevier Interactive Patient Education  2018 [employer]vier Inc[employer]  

## 2018-08-10 NOTE — Progress Notes (Signed)
   PRENATAL VISIT NOTE  Subjective:  Melissa Farmer is a 22 y.o. G1P0 at 335w4d being seen today for ongoing prenatal care.  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  Patient reports irregular contractions that feel like occasional cramping.  Contractions: Not present. Vag. Bleeding: None.  Movement: Present. Denies leaking of fluid.   The following portions of the patient's history were reviewed and updated as appropriate: allergies, current medications, past family history, past medical history, past social history, past surgical history and problem list. Problem list updated.  Objective:   Vitals:   08/10/18 0820  BP: 132/72  Pulse: 73  Weight: 136 lb 6.4 oz (61.9 kg)    Fetal Status: Fetal Heart Rate (bpm): 150 Fundal Height: 33 cm Movement: Present     General:  Alert, oriented and cooperative. Patient is in no acute distress.  Skin: Skin is warm and dry. No rash noted.   Cardiovascular: Normal heart rate noted  Respiratory: Normal respiratory effort, no problems with respiration noted  Abdomen: Soft, gravid, appropriate for gestational age.  Pain/Pressure: Absent     Pelvic: Cervical exam deferred        Extremities: Normal range of motion.  Edema: None  Mental Status: Normal mood and affect. Normal behavior. Normal judgment and thought content.   Assessment and Plan:  Pregnancy: G1P0 at 4635w4d  1. Supervision of normal first pregnancy, antepartum -Waterbirth consent signed; patient is enthusiastic about waterbirth.  -Discussed next visit (GBS test, etc).  -Reviewed labor signs; explained difference between [facility] and true labor.   Preterm labor symptoms and general obstetric precautions including but not limited to vaginal bleeding, contractions, leaking of  fluid and fetal movement were reviewed in detail with the patient. Please refer to After Visit Summary for other counseling recommendations.  Return in about 4 weeks (around 09/07/2018).  Future Appointments  Date Time Provider Department Center  09/07/2018  9:15 AM Marylene LandKooistra, [place] Cocuzza [place], CNM [facility]    Charlesetta GaribaldiKathryn [place]

## 2018-08-12 ENCOUNTER — Encounter: Payer: Self-pay | Admitting: *Deleted

## 2018-09-07 ENCOUNTER — Other Ambulatory Visit (HOSPITAL_COMMUNITY)
Admission: RE | Admit: 2018-09-07 | Discharge: 2018-09-07 | Disposition: A | Discharge status: Home / Self Care | Payer: BLUE CROSS/BLUE SHIELD | Source: Ambulatory Visit | Attending: Student | Admitting: Student

## 2018-09-07 ENCOUNTER — Ambulatory Visit (INDEPENDENT_AMBULATORY_CARE_PROVIDER_SITE_OTHER): Payer: BLUE CROSS/BLUE SHIELD | Admitting: Student

## 2018-09-07 VITALS — BP 130/77 | HR 90 | Wt 140.4 lb

## 2018-09-07 DIAGNOSIS — Z3403 Encounter for supervision of normal first pregnancy, third trimester: Secondary | ICD-10-CM | POA: Insufficient documentation

## 2018-09-07 DIAGNOSIS — Z3A37 37 weeks gestation of pregnancy: Secondary | ICD-10-CM | POA: Diagnosis not present

## 2018-09-07 DIAGNOSIS — Z3400 Encounter for supervision of normal first pregnancy, unspecified trimester: Secondary | ICD-10-CM

## 2018-09-07 NOTE — Progress Notes (Signed)
   PRENATAL VISIT NOTE  Subjective:  Melissa Farmer is a 22 y.o. G1P0 at [redacted]w[redacted]d being seen today for ongoing prenatal care.  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  Patient reports no complaints.  Contractions: Irritability. Vag. Bleeding: None.  Movement: Present. Denies leaking of fluid.   The following portions of the patient's history were reviewed and updated as appropriate: allergies, current medications, past family history, past medical history, past social history, past surgical history and problem list. Problem list updated.  Objective:   Vitals:   09/07/18 0926  BP: 130/77  Pulse: 90  Weight: 140 lb 6.4 oz (63.7 kg)    Fetal Status: Fetal Heart Rate (bpm): 184   Movement: Present     General:  Alert, oriented and cooperative. Patient is in no acute distress.  Skin: Skin is warm and dry. No rash noted.   Cardiovascular: Normal heart rate noted  Respiratory: Normal respiratory effort, no problems with respiration noted  Abdomen: Soft, gravid, appropriate for gestational age.  Pain/Pressure: Absent     Pelvic: Cervical exam performed        Extremities: Normal range of motion.  Edema: None  Mental Status: Normal mood and affect. Normal behavior. Normal judgment and thought content.   Assessment and Plan:  Pregnancy: G1P0 at [redacted]w[redacted]d  1. Supervision of normal first pregnancy, antepartum  - GC/Chlamydia probe amp ([place])not at [facility] - Culture, beta strep (group b only)  Term labor symptoms and general obstetric precautions including but not limited to vaginal bleeding, contractions, leaking of fluid and fetal movement were reviewed in detail with the patient. Please refer to After Visit Summary for other counseling recommendations.  No follow-ups on  file.  No future appointments.  Marylene Land, CNM

## 2018-09-07 NOTE — Patient Instructions (Signed)

## 2018-09-08 LAB — GC/CHLAMYDIA PROBE AMP (~~LOC~~) NOT AT ARMC
Chlamydia: NEGATIVE
NEISSERIA GONORRHEA: NEGATIVE

## 2018-09-10 LAB — CULTURE, BETA STREP (GROUP B ONLY): Strep Gp B Culture: NEGATIVE

## 2018-09-14 ENCOUNTER — Ambulatory Visit (INDEPENDENT_AMBULATORY_CARE_PROVIDER_SITE_OTHER): Payer: BLUE CROSS/BLUE SHIELD | Admitting: Advanced Practice Midwife

## 2018-09-14 VITALS — BP 129/76 | HR 90 | Wt 141.0 lb

## 2018-09-14 DIAGNOSIS — Z3400 Encounter for supervision of normal first pregnancy, unspecified trimester: Secondary | ICD-10-CM

## 2018-09-14 NOTE — Progress Notes (Signed)
   PRENATAL VISIT NOTE  Subjective:  Melissa Farmer is a 22 y.o. G1P0 at [redacted]w[redacted]d being seen today for ongoing prenatal care.  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  Patient reports no complaints.  Contractions: Irritability. Vag. Bleeding: None.  Movement: Present. Denies leaking of fluid.   The following portions of the patient's history were reviewed and updated as appropriate: allergies, current medications, past family history, past medical history, past social history, past surgical history and problem list. Problem list updated.  Objective:   Vitals:   09/14/18 1003  BP: 129/76  Pulse: 90  Weight: 64 kg    Fetal Status: Fetal Heart Rate (bpm): 147   Movement: Present     General:  Alert, oriented and cooperative. Patient is in no acute distress.  Skin: Skin is warm and dry. No rash noted.   Cardiovascular: Normal heart rate noted  Respiratory: Normal respiratory effort, no problems with respiration noted  Abdomen: Soft, gravid, appropriate for gestational age.  Pain/Pressure: Absent     Pelvic: Cervical exam deferred        Extremities: Normal range of motion.  Edema: None  Mental Status: Normal mood and affect. Normal behavior. Normal judgment and thought content.   Assessment and Plan:  Pregnancy: G1P0 at [redacted]w[redacted]d  1. Supervision of normal first pregnancy, antepartum --Anticipatory guidance about next visits/weeks of pregnancy given. --Cervix checked on pt request, 1/70/-2 --Pt planning waterbirth, has supplies  Term labor symptoms and general obstetric precautions including but not limited to vaginal bleeding, contractions, leaking of fluid and fetal movement were reviewed in detail with the patient. Please refer to After Visit Summary for other counseling  recommendations.  No follow-ups on file.  No future appointments.  Sharen Counter, CNM

## 2018-09-14 NOTE — Patient Instructions (Signed)

## 2018-09-21 ENCOUNTER — Ambulatory Visit (INDEPENDENT_AMBULATORY_CARE_PROVIDER_SITE_OTHER): Payer: BLUE CROSS/BLUE SHIELD | Admitting: Obstetrics and Gynecology

## 2018-09-21 ENCOUNTER — Encounter: Payer: Self-pay | Admitting: Obstetrics and Gynecology

## 2018-09-21 ENCOUNTER — Inpatient Hospital Stay (HOSPITAL_COMMUNITY)
Admission: AD | Admit: 2018-09-21 | Discharge: 2018-09-22 | Disposition: A | Discharge status: Home / Self Care | Payer: BLUE CROSS/BLUE SHIELD | Source: Ambulatory Visit | Attending: Family Medicine | Admitting: Family Medicine

## 2018-09-21 ENCOUNTER — Encounter (HOSPITAL_COMMUNITY): Payer: Self-pay

## 2018-09-21 ENCOUNTER — Encounter (HOSPITAL_COMMUNITY): Payer: Self-pay | Admitting: *Deleted

## 2018-09-21 ENCOUNTER — Telehealth (HOSPITAL_COMMUNITY): Payer: Self-pay | Admitting: *Deleted

## 2018-09-21 VITALS — BP 132/76 | HR 91 | Wt 140.3 lb

## 2018-09-21 DIAGNOSIS — Z3A39 39 weeks gestation of pregnancy: Secondary | ICD-10-CM

## 2018-09-21 DIAGNOSIS — O471 False labor at or after 37 completed weeks of gestation: Secondary | ICD-10-CM

## 2018-09-21 DIAGNOSIS — R030 Elevated blood-pressure reading, without diagnosis of hypertension: Secondary | ICD-10-CM

## 2018-09-21 DIAGNOSIS — O479 False labor, unspecified: Secondary | ICD-10-CM

## 2018-09-21 DIAGNOSIS — Z3400 Encounter for supervision of normal first pregnancy, unspecified trimester: Secondary | ICD-10-CM

## 2018-09-21 LAB — URINALYSIS, ROUTINE W REFLEX MICROSCOPIC
Bilirubin Urine: NEGATIVE
Glucose, UA: NEGATIVE mg/dL
Hgb urine dipstick: NEGATIVE
KETONES UR: NEGATIVE mg/dL
LEUKOCYTES UA: NEGATIVE
NITRITE: NEGATIVE
PROTEIN: NEGATIVE mg/dL
Specific Gravity, Urine: 1.016 (ref 1.005–1.030)
pH: 6 (ref 5.0–8.0)

## 2018-09-21 LAB — COMPREHENSIVE METABOLIC PANEL
ALT: 13 U/L (ref 0–44)
ANION GAP: 8 (ref 5–15)
AST: 24 U/L (ref 15–41)
Albumin: 3.4 g/dL — ABNORMAL LOW (ref 3.5–5.0)
Alkaline Phosphatase: 79 U/L (ref 38–126)
BUN: 9 mg/dL (ref 6–20)
CHLORIDE: 104 mmol/L (ref 98–111)
CO2: 24 mmol/L (ref 22–32)
Calcium: 9.7 mg/dL (ref 8.9–10.3)
Creatinine, Ser: 0.54 mg/dL (ref 0.44–1.00)
GFR calc non Af Amer: >60 mL/min (ref >60)
Glucose, Bld: 90 mg/dL (ref 70–99)
POTASSIUM: 3.6 mmol/L (ref 3.5–5.1)
Sodium: 136 mmol/L (ref 135–145)
Total Bilirubin: 0.5 mg/dL (ref 0.3–1.2)
Total Protein: 7 g/dL (ref 6.5–8.1)

## 2018-09-21 LAB — PROTEIN / CREATININE RATIO, URINE
Creatinine, Urine: 84 mg/dL
PROTEIN CREATININE RATIO: 0.08 mg/mg{creat} (ref 0.00–0.15)
TOTAL PROTEIN, URINE: 7 mg/dL

## 2018-09-21 LAB — CBC
HCT: 35.8 % — ABNORMAL LOW (ref 36.0–46.0)
Hemoglobin: 12.4 g/dL (ref 12.0–15.0)
MCH: 33.2 pg (ref 26.0–34.0)
MCHC: 34.6 g/dL (ref 30.0–36.0)
MCV: 95.7 fL (ref 80.0–100.0)
PLATELETS: 152 10*3/uL (ref 150–400)
RBC: 3.74 MIL/uL — AB (ref 3.87–5.11)
RDW: 12.6 % (ref 11.5–15.5)
WBC: 7.9 10*3/uL (ref 4.0–10.5)
nRBC: 0 % (ref 0.0–0.2)

## 2018-09-21 MED ORDER — ONDANSETRON 8 MG PO TBDP
8.0000 mg | ORAL_TABLET | Freq: Once | ORAL | Status: AC
  Administered 2018-09-21: 8 mg via ORAL
  Filled 2018-09-21: qty 1

## 2018-09-21 NOTE — MAU Provider Note (Signed)
RN Labor Check   Received call from RN regarding labor check. Briefly, this is a 22yo G1P0 with a relatively uncomplicated pregnancy who presents with contractions. She was seen today in clinic and was 2.5/70/-2. This evening in MAU she was 3.5/80-90/-2 and contracting irregularly. She desires a Systems developer. Not in active labor and stable for discharge home. Patient did have one elevated BP in MAU - PIH labs, UPC collected and wnl. Has had not other elevated BP this pregnancy. Will send message to have BP check scheduled for this week. FHT reassuring 135/mod/+a/-d, negative CST, reactive strip. Term labor precautions reviewed prior to discharge.  Cristal Deer. Earlene Plater, DO OB/GYN Fellow

## 2018-09-21 NOTE — Progress Notes (Signed)
   PRENATAL VISIT NOTE  Subjective:  Melissa Farmer is a 22 y.o. G1P0 at [redacted]w[redacted]d being seen today for ongoing prenatal care.  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  Patient reports regular contractions that are tolerable.  Contractions: Regular. Vag. Bleeding: None.  Movement: Present. Denies leaking of fluid.   The following portions of the patient's history were reviewed and updated as appropriate: allergies, current medications, past family history, past medical history, past social history, past surgical history and problem list. Problem list updated.  Objective:   Vitals:   09/21/18 0937  BP: 132/76  Pulse: 91  Weight: 140 lb 4.8 oz (63.6 kg)    Fetal Status: Fetal Heart Rate (bpm): 138 Fundal Height: 39 cm Movement: Present  Presentation: Vertex  General:  Alert, oriented and cooperative. Patient is in no acute distress.  Skin: Skin is warm and dry. No rash noted.   Cardiovascular: Normal heart rate noted  Respiratory: Normal respiratory effort, no problems with respiration noted  Abdomen: Soft, gravid, appropriate for gestational age.  Pain/Pressure: Present     Pelvic: Cervical exam performed Dilation: 2.5 Effacement (%): 70 Station: -2  Extremities: Normal range of motion.  Edema: None  Mental Status: Normal mood and affect. Normal behavior. Normal judgment and thought content.   Assessment and Plan:  Pregnancy: G1P0 at [redacted]w[redacted]d  1. Supervision of normal first pregnancy, antepartum Patient is doing well  Labor precautions reviewed Patient has water birth supplies Will plan for IOL at 41 weeks in the event of no labor  Term labor symptoms and general obstetric precautions including but not limited to vaginal bleeding, contractions, leaking of fluid and fetal  movement were reviewed in detail with the patient. Please refer to After Visit Summary for other counseling recommendations.  Return in about 1 week (around 09/28/2018) for ROB.  No future appointments.  Catalina Antigua, MD

## 2018-09-21 NOTE — MAU Note (Signed)
Pt having ctx about 4-5 min apart. Was checked in the office was 3cm today. Pain 6/10, no LOF or bleeding +FM

## 2018-09-21 NOTE — Telephone Encounter (Signed)
Preadmission screen  

## 2018-09-22 ENCOUNTER — Other Ambulatory Visit: Payer: Self-pay

## 2018-09-22 ENCOUNTER — Encounter: Payer: Self-pay | Admitting: *Deleted

## 2018-09-22 ENCOUNTER — Inpatient Hospital Stay (HOSPITAL_COMMUNITY)
Admission: AD | Admit: 2018-09-22 | Discharge: 2018-09-23 | DRG: 776 | Disposition: A | Discharge status: Home / Self Care | Payer: BLUE CROSS/BLUE SHIELD | Source: Ambulatory Visit | Attending: Family Medicine | Admitting: Family Medicine

## 2018-09-22 ENCOUNTER — Encounter (HOSPITAL_COMMUNITY): Payer: Self-pay

## 2018-09-22 DIAGNOSIS — Z3A39 39 weeks gestation of pregnancy: Secondary | ICD-10-CM

## 2018-09-22 MED ORDER — OXYTOCIN 10 UNIT/ML IJ SOLN
INTRAMUSCULAR | Status: AC
  Administered 2018-09-22: 10 [IU] via INTRAMUSCULAR
  Filled 2018-09-22: qty 1

## 2018-09-22 MED ORDER — TETANUS-DIPHTH-ACELL PERTUSSIS 5-2.5-18.5 LF-MCG/0.5 IM SUSP: 0.5000 mL | Freq: Once | INTRAMUSCULAR | Status: AC

## 2018-09-22 MED ORDER — BENZOCAINE-MENTHOL 20-0.5 % EX AERO
1.0000 "application " | INHALATION_SPRAY | CUTANEOUS | Status: DC | PRN
  Filled 2018-09-22: qty 56

## 2018-09-22 MED ORDER — COCONUT OIL OIL
1.0000 "application " | TOPICAL_OIL | Status: DC | PRN
  Filled 2018-09-22: qty 120

## 2018-09-22 MED ORDER — MEASLES, MUMPS & RUBELLA VAC ~~LOC~~ INJ
0.5000 mL | INJECTION | Freq: Once | SUBCUTANEOUS | Status: AC
  Filled 2018-09-22: qty 0.5

## 2018-09-22 MED ORDER — SIMETHICONE 80 MG PO CHEW: 80.0000 mg | CHEWABLE_TABLET | ORAL | Status: DC | PRN

## 2018-09-22 MED ORDER — ONDANSETRON HCL 4 MG/2ML IJ SOLN: 4.0000 mg | INTRAMUSCULAR | Status: DC | PRN

## 2018-09-22 MED ORDER — DIPHENHYDRAMINE HCL 25 MG PO CAPS: 25.0000 mg | ORAL_CAPSULE | Freq: Four times a day (QID) | ORAL | Status: DC | PRN

## 2018-09-22 MED ORDER — OXYTOCIN 10 UNIT/ML IJ SOLN
10.0000 [IU] | Freq: Once | INTRAMUSCULAR | Status: AC
  Administered 2018-09-22: 10 [IU] via INTRAMUSCULAR

## 2018-09-22 MED ORDER — SENNOSIDES-DOCUSATE SODIUM 8.6-50 MG PO TABS
2.0000 | ORAL_TABLET | ORAL | Status: DC
  Administered 2018-09-22: 2 via ORAL
  Filled 2018-09-22: qty 2

## 2018-09-22 MED ORDER — ACETAMINOPHEN 325 MG PO TABS: 650.0000 mg | ORAL_TABLET | ORAL | Status: DC | PRN

## 2018-09-22 MED ORDER — IBUPROFEN 600 MG PO TABS
600.0000 mg | ORAL_TABLET | Freq: Four times a day (QID) | ORAL | Status: DC
  Administered 2018-09-22 – 2018-09-23 (×6): 600 mg via ORAL
  Filled 2018-09-22 (×6): qty 1

## 2018-09-22 MED ORDER — WITCH HAZEL-GLYCERIN EX PADS: 1.0000 "application " | MEDICATED_PAD | CUTANEOUS | Status: DC | PRN

## 2018-09-22 MED ORDER — DIBUCAINE 1 % RE OINT
1.0000 "application " | TOPICAL_OINTMENT | RECTAL | Status: DC | PRN
  Filled 2018-09-22: qty 28

## 2018-09-22 MED ORDER — PRENATAL MULTIVITAMIN CH
1.0000 | ORAL_TABLET | Freq: Every day | ORAL | Status: DC
  Administered 2018-09-22 – 2018-09-23 (×2): 1 via ORAL
  Filled 2018-09-22 (×2): qty 1

## 2018-09-22 MED ORDER — ONDANSETRON HCL 4 MG PO TABS: 4.0000 mg | ORAL_TABLET | ORAL | Status: DC | PRN

## 2018-09-22 NOTE — Discharge Instructions (Signed)
Braxton Hicks Contractions °Contractions of the uterus can occur throughout pregnancy, but they are not always a sign that you are in labor. You may have practice contractions called Braxton Hicks contractions. These false labor contractions are sometimes confused with true labor. °What are Braxton Hicks contractions? °Braxton Hicks contractions are tightening movements that occur in the muscles of the uterus before labor. Unlike true labor contractions, these contractions do not result in opening (dilation) and thinning of the cervix. Toward the end of pregnancy (32-34 weeks), Braxton Hicks contractions can happen more often and may become stronger. These contractions are sometimes difficult to tell apart from true labor because they can be very uncomfortable. You should not feel embarrassed if you go to the hospital with false labor. °Sometimes, the only way to tell if you are in true labor is for your health care provider to look for changes in the cervix. The health care provider will do a physical exam and may monitor your contractions. If you are not in true labor, the exam should show that your cervix is not dilating and your water has not broken. °If there are other health problems associated with your pregnancy, it is completely safe for you to be sent home with false labor. You may continue to have Braxton Hicks contractions until you go into true labor. °How to tell the difference between true labor and false labor °True labor °· Contractions last 30-70 seconds. °· Contractions become very regular. °· Discomfort is usually felt in the top of the uterus, and it spreads to the lower abdomen and low back. °· Contractions do not go away with walking. °· Contractions usually become more intense and increase in frequency. °· The cervix dilates and gets thinner. °False labor °· Contractions are usually shorter and not as strong as true labor contractions. °· Contractions are usually irregular. °· Contractions  are often felt in the front of the lower abdomen and in the groin. °· Contractions may go away when you walk around or change positions while lying down. °· Contractions get weaker and are shorter-lasting as time goes on. °· The cervix usually does not dilate or become thin. °Follow these instructions at home: °· Take over-the-counter and prescription medicines only as told by your health care provider. °· Keep up with your usual exercises and follow other instructions from your health care provider. °· Eat and drink lightly if you think you are going into labor. °· If Braxton Hicks contractions are making you uncomfortable: °? Change your position from lying down or resting to walking, or change from walking to resting. °? Sit and rest in a tub of warm water. °? Drink enough fluid to keep your urine pale yellow. Dehydration may cause these contractions. °? Do slow and deep breathing several times an hour. °· Keep all follow-up prenatal visits as told by your health care provider. This is important. °Contact a health care provider if: °· You have a fever. °· You have continuous pain in your abdomen. °Get help right away if: °· Your contractions become stronger, more regular, and closer together. °· You have fluid leaking or gushing from your vagina. °· You pass blood-tinged mucus (bloody show). °· You have bleeding from your vagina. °· You have low back pain that you never had before. °· You feel your baby’s head pushing down and causing pelvic pressure. °· Your baby is not moving inside you as much as it used to. °Summary °· Contractions that occur before labor are called Braxton   Hicks contractions, false labor, or practice contractions. °· Braxton Hicks contractions are usually shorter, weaker, farther apart, and less regular than true labor contractions. True labor contractions usually become progressively stronger and regular and they become more frequent. °· Manage discomfort from Braxton Hicks contractions by  changing position, resting in a warm bath, drinking plenty of water, or practicing deep breathing. °This information is not intended to replace advice given to you by your health care provider. Make sure you discuss any questions you have with your health care provider. °Document Released: 03/19/2017 Document Revised: 03/19/2017 Document Reviewed: 03/19/2017 °Elsevier Interactive Patient Education © 2018 [employer] ° °

## 2018-09-22 NOTE — H&P (Signed)
OBSTETRIC ADMISSION HISTORY AND PHYSICAL  Melissa Farmer is a 22 y.o. female G1P0 with IUP at [redacted]w[redacted]d by L/19 presenting after home delivery. Patient was seen here in MAU after presenting in latent labor. Patient's mother states patient was uncomfortable at home with irregular contractions and started to feel urge to push. Mother states she saw baby's head then called EMS. Baby delivered at home via patient's mother at 94. EMS arrived shortly after and placenta delivered in route. No significant bleeding noted in transit.   She received her prenatal care at [facility].  Support person in labor: Mother  Ultrasounds . Anatomy U/S: circumvallate placenta without evidence of previa, normal fetal anatomy   Prenatal History/Complications: . None  Past Medical History: Past Medical History:  Diagnosis Date  . Medical history non-contributory     Past Surgical History: Past Surgical History:  Procedure Laterality Date  . NO PAST SURGERIES      Obstetrical History: OB History    Gravida  1   Para      Term      Preterm      AB      Living        SAB      TAB      Ectopic      Multiple      Live Births              Social History: Social History   Socioeconomic History  . Marital status: Single    Spouse name: Not on file  . Number of children: Not on file  . Years of education: Not on file  . Highest education level: Not on file  Occupational History  . Not on file  Social Needs  . Financial resource strain: Not hard at all  . Food insecurity:    Worry: Never true    Inability: Never true  . Transportation needs:    Medical: No    Non-medical: Not on file  Tobacco Use  . Smoking status: Never Smoker  . Smokeless tobacco: Never Used  Substance and Sexual Activity  . Alcohol use: No    Alcohol/week: 0.0 standard drinks  . Drug use: No  . Sexual activity: Yes    Birth control/protection: None  Lifestyle  . Physical activity:    Days per week:  Not on file    Minutes per session: Not on file  . Stress: Not at all  Relationships  . Social connections:    Talks on phone: Not on file    Gets together: Not on file    Attends religious service: Not on file    Active member of club or organization: Not on file    Attends meetings of clubs or organizations: Not on file    Relationship status: Not on file  Other Topics Concern  . 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    Family History: Family History  Problem Relation Age of Onset  . Kidney disease Maternal Grandmother   . Stroke Maternal Grandfather   . Diabetes Maternal Grandfather   . Heart disease Maternal Grandfather   . Cancer Paternal Grandmother   . Asthma Paternal Grandmother   . Asthma Father     Allergies: No Known Allergies  Medications Prior to Admission  Medication Sig Dispense Refill Last Dose  . Prenatal Vit-Fe Fumarate-FA (PREPLUS) 27-1 MG TABS Take 1 tablet by mouth daily. 30 tablet  12 Taking    Review of Systems  All systems reviewed and negative except as stated in HPI Vitals pending Last menstrual period 12/18/2017. General appearance: well-appearing, NAD, holding infant in arms  Lungs: no respiratory distress Heart: regular rate  Abdomen: soft, non-tender; fundus firm and below level of umbilicus Pelvic: SVE without evidence of lacerations  Extremities: No significant LE edema   Prenatal labs: ABO, Rh: O/Positive/-- (04/18 1314) Antibody: Negative (04/18 1314) Rubella: 2.25 (04/18 1314) RPR: Non Reactive (08/12 0909)  HBsAg: Negative (04/18 1314)  HIV: Non Reactive (08/12 0909)  GBS:   negative Glucola: nml 2-hr Genetic screening:  Low risk NIPS, normal AFP  Prenatal Transfer Tool  Maternal Diabetes: No Genetic Screening: Normal Maternal Ultrasounds/Referrals: Normal Fetal Ultrasounds or other Referrals:  None Maternal Substance Abuse:  No Significant Maternal  Medications:  None Significant Maternal Lab Results: None  Results for orders placed or performed during the hospital encounter of 09/21/18 (from the past 24 hour(s))  CBC   Collection Time: 09/21/18  9:01 PM  Result Value Ref Range   WBC 7.9 4.0 - 10.5 K/uL   RBC 3.74 (L) 3.87 - 5.11 MIL/uL   Hemoglobin 12.4 12.0 - 15.0 g/dL   HCT 29.5 (L) 62.1 - 30.8 %   MCV 95.7 80.0 - 100.0 fL   MCH 33.2 26.0 - 34.0 pg   MCHC 34.6 30.0 - 36.0 g/dL   RDW 65.7 84.6 - 96.2 %   Platelets 152 150 - 400 K/uL   nRBC 0.0 0.0 - 0.2 %  Comprehensive metabolic panel   Collection Time: 09/21/18  9:04 PM  Result Value Ref Range   Sodium 136 135 - 145 mmol/L   Potassium 3.6 3.5 - 5.1 mmol/L   Chloride 104 98 - 111 mmol/L   CO2 24 22 - 32 mmol/L   Glucose, Bld 90 70 - 99 mg/dL   BUN 9 6 - 20 mg/dL   Creatinine, Ser 9.52 0.44 - 1.00 mg/dL   Calcium 9.7 8.9 - 84.1 mg/dL   Total Protein 7.0 6.5 - 8.1 g/dL   Albumin 3.4 (L) 3.5 - 5.0 g/dL   AST 24 15 - 41 U/L   ALT 13 0 - 44 U/L   Alkaline Phosphatase 79 38 - 126 U/L   Total Bilirubin 0.5 0.3 - 1.2 mg/dL   GFR calc non Af Amer >60 >60 mL/min   GFR calc Af Amer >60 >60 mL/min   Anion gap 8 5 - 15  Urinalysis, Routine w reflex microscopic   Collection Time: 09/21/18  9:23 PM  Result Value Ref Range   Color, Urine YELLOW YELLOW   APPearance CLOUDY (A) CLEAR   Specific Gravity, Urine 1.016 1.005 - 1.030   pH 6.0 5.0 - 8.0   Glucose, UA NEGATIVE NEGATIVE mg/dL   Hgb urine dipstick NEGATIVE NEGATIVE   Bilirubin Urine NEGATIVE NEGATIVE   Ketones, ur NEGATIVE NEGATIVE mg/dL   Protein, ur NEGATIVE NEGATIVE mg/dL   Nitrite NEGATIVE NEGATIVE   Leukocytes, UA NEGATIVE NEGATIVE  Protein / creatinine ratio, urine   Collection Time: 09/21/18  9:23 PM  Result Value Ref Range   Creatinine, Urine 84.00 mg/dL   Total Protein, Urine 7 mg/dL   Protein Creatinine Ratio 0.08 0.00 - 0.15 mg/mg[Cre]    Patient Active Problem List   Diagnosis Date Noted  .  Vaginal delivery 09/22/2018  . 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 Supervision of normal first pregnancy, antepartum 03/04/2018  .  Headache 03/26/2016    Assessment/Plan:  Melissa Farmer is a 22 y.o. G1P0 at [redacted]w[redacted]d here after SVD at home. Brought in by EMS, placenta delivered in route. Placenta examined - appears intact, 3-vessel cord. Patient has bilateral, hemostatic labial lacerations. Minimal EBL reported. Fundus firm to palpation with few small clots expressed, will give IM pitocin. Stable for transfer to postpartum.   Postpartum Planning -- breast/IUD or Nexplanon -- RI/[x] Tdap   Johnella Crumm S. Earlene Plater, DO OB/GYN Fellow

## 2018-09-22 NOTE — MAU Note (Signed)
Provider stripped membranes before patient left.  Patient given all labor precautions and understands when to return to MAU.

## 2018-09-22 NOTE — MAU Note (Signed)
I have communicated with Joellyn Haff, CNM and reviewed vital signs:  Vitals:   09/22/18 0007 09/22/18 0009  BP: 106/62 109/62  Pulse:  (!) 104  Resp:    Temp:      Vaginal exam:  Dilation: 3.5 Effacement (%): 90 Cervical Position: Middle Station: -2 Presentation: Vertex Exam by:: TLYTLE RN ,   Also reviewed contraction pattern and that non-stress test is reactive.  It has been documented that patient is contracting irregularly  with minimal cervical change over 3 hours not indicating active labor.  Patient denies any other complaints.  Based on this report provider has given order for discharge.  A discharge order and diagnosis entered by a provider.   Labor discharge instructions reviewed with patient.

## 2018-09-22 NOTE — Lactation Note (Signed)
This note was copied from a baby's chart. Lactation Consultation Note  Patient Name: Melissa Farmer [id number] Date: 09/22/2018 Reason for consult: Initial assessment;Primapara;Term Breastfeeding consultation services and support information given.  Mom reports baby is feeding well.  Baby recently fed.  Instructed to feed with any feeding cue and encouraged to call for assist/concerns.  Maternal Data    Feeding    LATCH Score                   Interventions    Lactation Tools Discussed/Used     Consult Status Consult Status: Follow-up Date: 09/23/18 Follow-up type: In-patient    Melissa Farmer 09/22/2018, 3:35 PM

## 2018-09-23 LAB — RPR: RPR Ser Ql: NONREACTIVE

## 2018-09-23 MED ORDER — IBUPROFEN 600 MG PO TABS: 600.0000 mg | ORAL_TABLET | Freq: Four times a day (QID) | ORAL | 0 refills | Status: DC

## 2018-09-23 MED ORDER — ACETAMINOPHEN 325 MG PO TABS: 650.0000 mg | ORAL_TABLET | ORAL | 0 refills | Status: DC | PRN

## 2018-09-23 MED ORDER — SENNOSIDES-DOCUSATE SODIUM 8.6-50 MG PO TABS: 2.0000 | ORAL_TABLET | ORAL | 0 refills | Status: DC

## 2018-09-23 NOTE — Lactation Note (Signed)
This note was copied from a baby's chart. Lactation Consultation Note  Patient Name: Melissa Farmer [id number] Date: 09/23/2018 Reason for consult: Follow-up assessment   Mom states breastfeeding is going well.  No discomfort when nursing.  Mom latched infant in cross cradle and infant latched easily.  Lc encouraged more pillows and rolled blanket for increased support.    Infant lips flanged at breast and rhythmic jaw movement noted.   LC encouraged breast compression and massage and gave tips on keeping infant awake.    Infant fed for 20 minutes then self detached and fell asleep.  BF basics reviewed with mom, supply and demand information given and engorgement prevention.    Support group info shared with mom as well.  Encouraged mom to call out for questions or concerns prior to dc.    Maternal Data Has patient been taught Hand Expression?: Yes  Feeding Feeding Type: Breast Fed  LATCH Score Latch: Grasps breast easily, tongue down, lips flanged, rhythmical sucking.  Audible Swallowing: A few with stimulation(only with compression)  Type of Nipple: Everted at rest and after stimulation  Comfort (Breast/Nipple): Soft / non-tender  Hold (Positioning): Assistance needed to correctly position infant at breast and maintain latch.  LATCH Score: 8  Interventions Interventions: Breast feeding basics reviewed;Assisted with latch;Skin to skin;Breast massage;Hand express;Breast compression;Adjust position;Support pillows;Position options  Lactation Tools Discussed/Used     Consult Status Consult Status: Complete Date: 09/24/18 Follow-up type: In-patient    [name] [facility] 09/23/2018, 11:13 AM

## 2018-09-23 NOTE — Discharge Instructions (Signed)

## 2018-09-23 NOTE — Plan of Care (Signed)
Patient appropriate for discharge.

## 2018-09-23 NOTE — Discharge Summary (Addendum)
Obstetrics Discharge Summary OB/GYN Faculty Practice   Patient Name: Melissa Farmer DOB: Jan 29, 1996 MRN: [id number]  Date of admission: 09/22/2018 Delivering MD:     Date of discharge: 09/23/2018  Admitting diagnosis: DELIVERED AT HOME Intrauterine pregnancy: [redacted]w[redacted]d     Secondary diagnosis:   Active Problems:   Vaginal delivery  Additional problems:  . None     Discharge diagnosis: Term Pregnancy Delivered                                            Postpartum procedures: None  Complications: None  Hospital course: Melissa Farmer is a 22 y.o. [redacted]w[redacted]d who was admitted after home delivery. Her pregnancy was uncomplicated. Patient was seen in the MAU several hours prior to delivery but was sent home after no cervical change for 3 hours with irregular contractions. Delivery occurred at home. Her postpartum course was uncomplicated. She was breastfeeding without difficulty. By day of discharge, she was passing flatus, urinating, eating and drinking without difficulty. Her pain was well-controlled, and she was discharged home with tylenol and ibuprofen. She will follow-up in clinic in 4 weeks.   Physical exam  Vitals:   09/22/18 0930 09/22/18 1318 09/22/18 1715 09/23/18 0528  BP: 119/70 121/70 115/68 (!) 100/49  Pulse: 65 68 66 71  Resp: 19 17 17    Temp: 98.1 F (36.7 C) 98.2 F (36.8 C) 98.1 F (36.7 C)   TempSrc: Oral Oral Oral   SpO2:    100%  Weight:      Height:       Labs: Lab Results  Component Value Date   WBC 7.9 09/21/2018   HGB 12.4 09/21/2018   HCT 35.8 (L) 09/21/2018   MCV 95.7 09/21/2018   PLT 152 09/21/2018   CMP Latest Ref Rng & Units 09/21/2018  Glucose 70 - 99 mg/dL 90  BUN 6 - 20 mg/dL 9  Creatinine 1.47 - 8.29 mg/dL 5.62  Sodium 130 - 865 mmol/L 136  Potassium 3.5 - 5.1 mmol/L 3.6  Chloride 98 - 111 mmol/L 104  CO2 22 - 32 mmol/L 24  Calcium 8.9 - 10.3 mg/dL 9.7  Total Protein 6.5 - 8.1 g/dL 7.0  Total Bilirubin 0.3 - 1.2 mg/dL 0.5   Alkaline Phos 38 - 126 U/L 79  AST 15 - 41 U/L 24  ALT 0 - 44 U/L 13    Discharge instructions: Per After Visit Summary and "Baby and Me Booklet"  After visit meds:  Allergies as of 09/23/2018   No Known Allergies     Medication List    STOP taking these medications   PREPLUS 27-1 MG Tabs     TAKE these medications   acetaminophen 325 MG tablet Commonly known as:  TYLENOL Take 2 tablets (650 mg total) by mouth every 4 (four) hours as needed (for pain scale < 4).   ibuprofen 600 MG tablet Commonly known as:  ADVIL,MOTRIN Take 1 tablet (600 mg total) by mouth every 6 (six) hours.   senna-docusate 8.6-50 MG tablet Commonly known as:  Senokot-S Take 2 tablets by mouth daily. Start taking on:  09/24/2018       Postpartum contraception: IUD Diet: Routine Diet Activity: Advance as tolerated. Pelvic rest for 6 weeks.   Outpatient follow up:4 weeks Follow-up Appt: Future Appointments  Date Time Provider Department Center  10/27/2018  1:55 PM Nolene Bernheim  L, NP [facility]   Follow-up Visit:No follow-ups on file.  Newborn Data: Live born female  Birth Weight: 6 lb 9.6 oz (2995 g) APGAR: ,   Newborn Delivery   Birth date/time:  09/22/2018 01:08:00 Delivery type:  Vaginal, Spontaneous     Baby Feeding: Breast Disposition:home with mother  Burman Nieves, MD Family Medicine Resident   I confirm that I have verified the information documented in the resident's note and that I have also personally reperformed the physical exam and all medical decision making activities.   Luna Kitchens CNM

## 2018-09-28 ENCOUNTER — Encounter: Payer: BLUE CROSS/BLUE SHIELD | Admitting: Student

## 2018-10-01 ENCOUNTER — Inpatient Hospital Stay (HOSPITAL_COMMUNITY): Admission: RE | Admit: 2018-10-01 | Payer: BLUE CROSS/BLUE SHIELD | Source: Ambulatory Visit

## 2018-10-27 ENCOUNTER — Ambulatory Visit (INDEPENDENT_AMBULATORY_CARE_PROVIDER_SITE_OTHER): Payer: BLUE CROSS/BLUE SHIELD | Admitting: Nurse Practitioner

## 2018-10-27 DIAGNOSIS — Z30011 Encounter for initial prescription of contraceptive pills: Secondary | ICD-10-CM | POA: Diagnosis not present

## 2018-10-27 MED ORDER — NORGESTIMATE-ETH ESTRADIOL 0.25-35 MG-MCG PO TABS: 1.0000 | ORAL_TABLET | Freq: Every day | ORAL | 2 refills | Status: DC

## 2018-10-27 NOTE — Patient Instructions (Addendum)
Be sure to use condoms for the first 4 weeks Return in 2 months for BP check   Oral Contraception Use Oral contraceptive pills (OCPs) are medicines taken to prevent pregnancy. OCPs work by preventing the ovaries from releasing eggs. The hormones in OCPs also cause the cervical mucus to thicken, preventing the sperm from entering the uterus. The hormones also cause the uterine lining to become thin, not allowing a fertilized egg to attach to the inside of the uterus. OCPs are highly effective when taken exactly as prescribed. However, OCPs do not prevent sexually transmitted diseases (STDs). Safe sex practices, such as using condoms along with an OCP, can help prevent STDs. Before taking OCPs, you may have a physical exam and Pap test. Your health care provider may also order blood tests if necessary. Your health care provider will make sure you are a good candidate for oral contraception. Discuss with your health care provider the possible side effects of the OCP you may be prescribed. When starting an OCP, it can take 2 to 3 months for the body to adjust to the changes in hormone levels in your body. How to take oral contraceptive pills Your health care provider may advise you on how to start taking the first cycle of OCPs. Otherwise, you can:  Start on day 1 of your menstrual period. You will not need any backup contraceptive protection with this start time.  Start on the first Sunday after your menstrual period or the day you get your prescription. In these cases, you will need to use backup contraceptive protection for the first week.  Start the pill at any time of your cycle. If you take the pill within 5 days of the start of your period, you are protected against pregnancy right away. In this case, you will not need a backup form of birth control. If you start at any other time of your menstrual cycle, you will need to use another form of birth control for 7 days. If your OCP is the type called a  minipill, it will protect you from pregnancy after taking it for 2 days (48 hours).  After you have started taking OCPs:  If you forget to take 1 pill, take it as soon as you remember. Take the next pill at the regular time.  If you miss 2 or more pills, call your health care provider because different pills have different instructions for missed doses. Use backup birth control until your next menstrual period starts.  If you use a 28-day pack that contains inactive pills and you miss 1 of the last 7 pills (pills with no hormones), it will not matter. Throw away the rest of the non-hormone pills and start a new pill pack.  No matter which day you start the OCP, you will always start a new pack on that same day of the week. Have an extra pack of OCPs and a backup contraceptive method available in case you miss some pills or lose your OCP pack. Follow these instructions at home:  Do not smoke.  Always use a condom to protect against STDs. OCPs do not protect against STDs.  Use a calendar to mark your menstrual period days.  Read the information and directions that came with your OCP. Talk to your health care provider if you have questions. Contact a health care provider if:  You develop nausea and vomiting.  You have abnormal vaginal discharge or bleeding.  You develop a rash.  You miss your  menstrual period.  You are losing your hair.  You need treatment for mood swings or depression.  You get dizzy when taking the OCP.  You develop acne from taking the OCP.  You become pregnant. Get help right away if:  You develop chest pain.  You develop shortness of breath.  You have an uncontrolled or severe headache.  You develop numbness or slurred speech.  You develop visual problems.  You develop pain, redness, and swelling in the legs. This information is not intended to replace advice given to you by your health care provider. Make sure you discuss any questions you have  with your health care provider. Document Released: 10/23/2011 Document Revised: 04/10/2016 Document Reviewed: 04/24/2013 Elsevier Interactive Patient Education  2017 [employer]

## 2018-10-27 NOTE — Progress Notes (Signed)
Subjective:     Melissa Farmer is a 22 y.o. female who presents for a postpartum visit. She is 6 weeks postpartum following a spontaneous vaginal delivery - delivered at home after being sent home from a labor check.  Delivered approx one hour after going home.  I have fully reviewed the prenatal and intrapartum course. The delivery was at 39.5 gestational weeks. Outcome: spontaneous vaginal delivery. Anesthesia: none. Postpartum course has been unremarkable. Baby's course has been unremarkable. Baby is feeding by both breast and bottle - Gerber goodstart . Bleeding thin lochia. Bowel function is normal. Bladder function is normal. Patient is not sexually active. Contraception method is OCP (estrogen/progesterone). Postpartum depression screening: negative.  The following portions of the patient's history were reviewed and updated as appropriate: allergies, current medications, past family history, past medical history, past social history, past surgical history and problem list.  Review of Systems Pertinent items noted in HPI and remainder of comprehensive ROS otherwise negative.   Objective:    There were no vitals taken for this visit.  General:  alert, cooperative and no distress   Breasts:  deferred  Lungs: clear to auscultation bilaterally  Heart:  regular rate and rhythm, S1, S2 normal, no murmur, click, rub or gallop  Abdomen: soft, non-tender; bowel sounds normal; no masses,  no organomegaly   Vulva:  not evaluated  Vagina: not evaluated  Cervix:  no examined  Corpus: not examined  Adnexa:  not evaluated  Rectal Exam: Not performed.        Assessment:    Normal postpartum exam. Pap smear not done at today's visit.  Prescription of birth control pills.  Plan:    1. Contraception: OCP (estrogen/progesterone) 2. Begin pills on Sunday.  Condoms if having sex in the next 4 weeks.  Take your pills daily. 3. Follow up in: 2 months or as needed.  Will need BP check and pill  refill.  Nolene BernheimERRI Mong Neal, RN, MSN, NP-BC Nurse Practitioner, [facility], [facility] 10/27/2018 3:19 PM

## 2018-10-28 LAB — POCT PREGNANCY, URINE: Preg Test, Ur: NEGATIVE

## 2018-11-22 ENCOUNTER — Ambulatory Visit (INDEPENDENT_AMBULATORY_CARE_PROVIDER_SITE_OTHER): Payer: Medicaid Other | Admitting: Clinical

## 2018-11-22 DIAGNOSIS — F4323 Adjustment disorder with mixed anxiety and depressed mood: Secondary | ICD-10-CM

## 2018-11-22 NOTE — BH Specialist Note (Signed)
Integrated Behavioral Health Initial Visit  MRN: [id number] Name: Melissa Farmer  Number of Integrated Behavioral Health Clinician visits:: 1/6 Session Start time: 11:03   Session End time: 11:43 Total time: 40 minutes  Type of Service: Integrated Behavioral Health- Individual/Family Interpretor:No. Interpretor Name and Language: n/a   Warm Hand Off Completed.       SUBJECTIVE: Melissa Farmer is a 23 y.o. female accompanied by n/a Patient was referred by Nolene Bernheim, NP for depression. Patient reports the following symptoms/concerns: Pt states her primary concern today is feeling depressed, overwhelmed, mild lack of appetite, irritability, and worrying that she should feel happier postpartum. Pt says she has felt depressed in the past, but was able to "shake it off"; prefers to cope without medication at this time. Pt attributes increase postpartum to FOB's infidelity during pregnancy.  Duration of problem: Increase postpartum, about 2 months; Severity of problem: moderate  OBJECTIVE: Mood: Anxious and Depressed and Affect: Tearful Risk of harm to self or others: No plan to harm self or others  LIFE CONTEXT: Family and Social: Pt lives with family and 9mo. Pt's grandmother has schizophrenia.  School/Work: - Self-Care: Recognizing a greater need for self-care Life Changes: Recent childbirth (at home, 1 hour after leaving hospital)  GOALS ADDRESSED: Patient will: 1. Reduce symptoms of: anxiety, depression and stress 2. Increase knowledge and/or ability of: healthy habits and self-management skills  3. Demonstrate ability to: Increase healthy adjustment to current life circumstances and Increase adequate support systems for patient/family  INTERVENTIONS: Interventions utilized: Mindfulness or Management consultant, Psychoeducation and/or Health Education and Link to [facility]  Standardized Assessments completed: GAD-7 and PHQ 9  ASSESSMENT: Patient  currently experiencing Adjustment disorder with mixed anxious and depressed mood.   Patient may benefit from psychoeducation and brief therapeutic interventions regarding coping with symptoms of anxiety and depression .  PLAN: 1. Follow up with behavioral health clinician on : Postpartum visit 2. Behavioral recommendations:  -CALM relaxation breathing exercise twice daily (morning; bedtime) -Read educational materials regarding coping with symptoms of anxiety and depression -Consider NAMI as additional family support 3. Referral(s): Integrated Art gallery manager (In Clinic), Community Mental Health Services (LME/Outside Clinic) and Community Resources:  NAMI 4. "From scale of 1-10, how likely are you to follow plan?": 10  Rae Lips, LCSW  Depression screen [facility] 2/9 09/21/2018 09/14/2018 09/07/2018 08/10/2018 06/28/2018  Decreased Interest 0 0 0 0 0  Down, Depressed, Hopeless 0 0 0 1 0  PHQ - 2 Score 0 0 0 1 0  Altered sleeping 3 2 2  0 2  Tired, decreased energy 0 0 1 2 0  Change in appetite 0 0 0 0 0  Feeling bad or failure about yourself  0 0 0 0 0  Trouble concentrating 0 0 0 0 0  Moving slowly or fidgety/restless 0 0 0 0 0  Suicidal thoughts 0 0 0 0 0  PHQ-9 Score 3 2 3 3 2    GAD 7 : Generalized Anxiety Score 09/21/2018 09/14/2018 09/07/2018 08/10/2018  Nervous, Anxious, on Edge 0 0 0 0  Control/stop worrying 0 0 0 0  Worry too much - different things 0 0 0 1  Trouble relaxing 2 0 0 0  Restless 2 0 0 0  Easily annoyed or irritable 0 1 1 3   Afraid - awful might happen 0 0 0 0  Total GAD 7 Score 4 1 1  4

## 2018-12-28 ENCOUNTER — Ambulatory Visit: Payer: BLUE CROSS/BLUE SHIELD

## 2019-02-03 ENCOUNTER — Other Ambulatory Visit: Payer: Self-pay | Admitting: Nurse Practitioner

## 2019-02-08 ENCOUNTER — Other Ambulatory Visit: Payer: Self-pay

## 2019-02-08 DIAGNOSIS — Z30011 Encounter for initial prescription of contraceptive pills: Secondary | ICD-10-CM

## 2019-02-08 MED ORDER — NORGESTIMATE-ETH ESTRADIOL 0.25-35 MG-MCG PO TABS: 1.0000 | ORAL_TABLET | Freq: Every day | ORAL | 3 refills | Status: DC

## 2019-06-23 ENCOUNTER — Other Ambulatory Visit: Payer: Self-pay | Admitting: Student

## 2019-06-23 ENCOUNTER — Other Ambulatory Visit: Payer: Self-pay | Admitting: Lactation Services

## 2019-06-23 DIAGNOSIS — Z30011 Encounter for initial prescription of contraceptive pills: Secondary | ICD-10-CM

## 2019-06-23 MED ORDER — NORGESTIMATE-ETH ESTRADIOL 0.25-35 MG-MCG PO TABS: 1.0000 | ORAL_TABLET | Freq: Every day | ORAL | 3 refills | Status: DC

## 2019-08-13 ENCOUNTER — Encounter: Payer: Self-pay | Admitting: Family Medicine

## 2019-08-13 ENCOUNTER — Telehealth: Payer: Medicaid Other | Admitting: Nurse Practitioner

## 2019-08-13 ENCOUNTER — Inpatient Hospital Stay
Admission: RE | Admit: 2019-08-13 | Discharge: 2019-08-13 | Disposition: A | Discharge status: Home / Self Care | Payer: BLUE CROSS/BLUE SHIELD | Source: Ambulatory Visit

## 2019-08-13 DIAGNOSIS — R05 Cough: Secondary | ICD-10-CM

## 2019-08-13 DIAGNOSIS — R059 Cough, unspecified: Secondary | ICD-10-CM

## 2019-08-13 MED ORDER — BENZONATATE 100 MG PO CAPS: 100.0000 mg | ORAL_CAPSULE | Freq: Three times a day (TID) | ORAL | 0 refills | Status: DC | PRN

## 2019-08-13 MED ORDER — PREDNISONE 10 MG (21) PO TBPK: ORAL_TABLET | ORAL | 0 refills | Status: DC

## 2019-08-13 NOTE — Progress Notes (Signed)
We are sorry that you are not feeling well.  Here is how we plan to help!  Based on your presentation I believe you most likely have A cough due to a virus.  This is called viral bronchitis and is best treated by rest, plenty of fluids and control of the cough.  You may use Ibuprofen or Tylenol as directed to help your symptoms.     In addition you may use A prescription cough medication called Tessalon Perles 100mg. You may take 1-2 capsules every 8 hours as needed for your cough.  Prednisone 10 mg daily for 6 days (see taper instructions below)  Directions for 6 day taper: Day 1: 2 tablets before breakfast, 1 after both lunch & dinner and 2 at bedtime Day 2: 1 tab before breakfast, 1 after both lunch & dinner and 2 at bedtime Day 3: 1 tab at each meal & 1 at bedtime Day 4: 1 tab at breakfast, 1 at lunch, 1 at bedtime Day 5: 1 tab at breakfast & 1 tab at bedtime Day 6: 1 tab at breakfast   From your responses in the eVisit questionnaire you describe inflammation in the upper respiratory tract which is causing a significant cough.  This is commonly called Bronchitis and has four common causes:    Allergies  Viral Infections  Acid Reflux  Bacterial Infection Allergies, viruses and acid reflux are treated by controlling symptoms or eliminating the cause. An example might be a cough caused by taking certain blood pressure medications. You stop the cough by changing the medication. Another example might be a cough caused by acid reflux. Controlling the reflux helps control the cough.  USE OF BRONCHODILATOR ("RESCUE") INHALERS: There is a risk from using your bronchodilator too frequently.  The risk is that over-reliance on a medication which only relaxes the muscles surrounding the breathing tubes can reduce the effectiveness of medications prescribed to reduce swelling and congestion of the tubes themselves.  Although you feel brief relief from the bronchodilator inhaler, your asthma may  actually be worsening with the tubes becoming more swollen and filled with mucus.  This can delay other crucial treatments, such as oral steroid medications. If you need to use a bronchodilator inhaler daily, several times per day, you should discuss this with your provider.  There are probably better treatments that could be used to keep your asthma under control.     HOME CARE . Only take medications as instructed by your medical team. . Complete the entire course of an antibiotic. . Drink plenty of fluids and get plenty of rest. . Avoid close contacts especially the very young and the elderly . Cover your mouth if you cough or cough into your sleeve. . Always remember to wash your hands . A steam or ultrasonic humidifier can help congestion.   GET HELP RIGHT AWAY IF: . You develop worsening fever. . You become short of breath . You cough up blood. . Your symptoms persist after you have completed your treatment plan MAKE SURE YOU   Understand these instructions.  Will watch your condition.  Will get help right away if you are not doing well or get worse.  Your e-visit answers were reviewed by a board certified advanced clinical practitioner to complete your personal care plan.  Depending on the condition, your plan could have included both over the counter or prescription medications. If there is a problem please reply  once you have received a response from your provider. Your   safety is important to us.  If you have drug allergies check your prescription carefully.    You can use MyChart to ask questions about today's visit, request a non-urgent call back, or ask for a work or school excuse for 24 hours related to this e-Visit. If it has been greater than 24 hours you will need to follow up with your provider, or enter a new e-Visit to address those concerns. You will get an e-mail in the next two days asking about your experience.  I hope that your e-visit has been valuable and will  speed your recovery. Thank you for using e-visits.  5-10 minutes spent reviewing and documenting in chart.  

## 2019-08-29 ENCOUNTER — Other Ambulatory Visit: Payer: Self-pay | Admitting: Lactation Services

## 2019-08-29 DIAGNOSIS — Z30011 Encounter for initial prescription of contraceptive pills: Secondary | ICD-10-CM

## 2019-08-29 MED ORDER — NORGESTIMATE-ETH ESTRADIOL 0.25-35 MG-MCG PO TABS: 1.0000 | ORAL_TABLET | Freq: Every day | ORAL | 3 refills | Status: DC

## 2019-08-29 NOTE — Progress Notes (Signed)
Pt reports she has no more refills at the pharmacy. Refills ordered.

## 2019-09-09 ENCOUNTER — Ambulatory Visit: Payer: Medicaid Other | Admitting: Family Medicine

## 2019-09-09 ENCOUNTER — Encounter: Payer: Self-pay | Admitting: Family Medicine

## 2019-09-09 ENCOUNTER — Other Ambulatory Visit: Payer: Self-pay

## 2019-09-09 DIAGNOSIS — H6690 Otitis media, unspecified, unspecified ear: Secondary | ICD-10-CM | POA: Insufficient documentation

## 2019-09-09 DIAGNOSIS — H66001 Acute suppurative otitis media without spontaneous rupture of ear drum, right ear: Secondary | ICD-10-CM | POA: Diagnosis not present

## 2019-09-09 MED ORDER — AMOXICILLIN-POT CLAVULANATE 875-125 MG PO TABS: 1.0000 | ORAL_TABLET | Freq: Two times a day (BID) | ORAL | 0 refills | Status: DC

## 2019-09-09 NOTE — Assessment & Plan Note (Signed)
-  Continue OTC analgesics as needed -Start augmentin x10 day.  -Call for new or worsening symptoms.

## 2019-09-09 NOTE — Patient Instructions (Signed)
Otitis Media, Adult  Otitis media means that the middle ear is red and swollen (inflamed) and full of fluid. The condition usually goes away on its own. Follow these instructions at home:  Take over-the-counter and prescription medicines only as told by your doctor.  If you were prescribed an antibiotic medicine, take it as told by your doctor. Do not stop taking the antibiotic even if you start to feel better.  Keep all follow-up visits as told by your doctor. This is important. Contact a doctor if:  You have bleeding from your nose.  There is a lump on your neck.  You are not getting better in 5 days.  You feel worse instead of better. Get help right away if:  You have pain that is not helped with medicine.  You have swelling, redness, or pain around your ear.  You get a stiff neck.  You cannot move part of your face (paralyzed).  You notice that the bone behind your ear hurts when you touch it.  You get a very bad headache. Summary  Otitis media means that the middle ear is red, swollen, and full of fluid.  This condition usually goes away on its own. In some cases, treatment may be needed.  If you were prescribed an antibiotic medicine, take it as told by your doctor. This information is not intended to replace advice given to you by your health care provider. Make sure you discuss any questions you have with your health care provider. Document Released: 04/21/2008 Document Revised: 10/16/2017 Document Reviewed: 11/24/2016 Elsevier Patient Education  2020 [employer] Inc[employer]  

## 2019-09-09 NOTE — Progress Notes (Signed)
Melissa Farmer - 23 y.o. female MRN [id number]  Date of birth: 08-Nov-1996  Subjective Chief Complaint  Patient presents with  . Otalgia    Right ear pain for 2 days    HPI Melissa Farmer is a 23 y.o. female here today with complaint of R sided ear pain.  She reports that pain started about 2 days ago.  Describes as throbbing sensation.  Some change in hearing on R.  Denies dizziness or nausea.  She has not had fever, congestion, body aches, or sore throat.  She has had mild relief from OTC medications.   ROS:  A comprehensive ROS was completed and negative except as noted per HPI  No Known Allergies  Past Medical History:  Diagnosis Date  . Medical history non-contributory     Past Surgical History:  Procedure Laterality Date  . NO PAST SURGERIES      Social History   Socioeconomic History  . Marital status: Single    Spouse name: Not on file  . Number of children: Not on file  . Years of education: Not on file  . Highest education level: Not on file  Occupational History  . Not on file  Social Needs  . Financial resource strain: Not hard at all  . Food insecurity    Worry: Never true    Inability: Never true  . Transportation needs    Medical: No    Non-medical: Not on file  Tobacco Use  . Smoking status: Never Smoker  . Smokeless tobacco: Never Used  Substance and Sexual Activity  . Alcohol use: No    Alcohol/week: 0.0 standard drinks  . Drug use: No  . Sexual activity: Yes    Birth control/protection: None  Lifestyle  . Physical activity    Days per week: Not on file    Minutes per session: Not on file  . Stress: Not at all  Relationships  . Social Herbalist on phone: Not on file    Gets together: Not on file    Attends religious service: Not on file    Active member of club or organization: Not on file    Attends meetings of clubs or organizations: Not on file    Relationship status: Not on file  Other Topics Concern  . 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    Family History  Problem Relation Age of Onset  . Kidney disease Maternal Grandmother   . Stroke Maternal Grandfather   . Diabetes Maternal Grandfather   . Heart disease Maternal Grandfather   . Cancer Paternal Grandmother   . Asthma Paternal Grandmother   . Asthma Father     Health Maintenance  Topic Date Due  . INFLUENZA VACCINE  06/18/2019  . PAP-Cervical Cytology Screening  07/21/2019  . CHLAMYDIA SCREENING  09/08/2019  . PAP SMEAR-Modifier  07/21/2019  . TETANUS/TDAP  06/28/2028  . HIV Screening  Completed    ----------------------------------------------------------------------------------------------------------------------------------------------------------------------------------------------------------------- Physical Exam BP 118/70   Pulse 68   Temp 98.3 F (36.8 C) (Oral)   Ht 5\' 6"  (1.676 m)   Wt 108 lb 3.2 oz (49.1 kg)   SpO2 100%   BMI 17.46 kg/m   Physical Exam Constitutional:      Appearance: Normal appearance.  HENT:     Head: Normocephalic and atraumatic.     Right Ear: Ear canal normal.     Left Ear:  Tympanic membrane and ear canal normal.     Ears:     Comments: R tm with erythema    Mouth/Throat:     Mouth: Mucous membranes are moist.  Eyes:     General: No scleral icterus. Neck:     Musculoskeletal: Neck supple.  Cardiovascular:     Rate and Rhythm: Normal rate and regular rhythm.  Pulmonary:     Effort: Pulmonary effort is normal.     Breath sounds: Normal breath sounds.  Skin:    General: Skin is warm and dry.  Neurological:     General: No focal deficit present.     Mental Status: She is alert.  Psychiatric:        Mood and Affect: Mood normal.        Behavior: Behavior normal.      ------------------------------------------------------------------------------------------------------------------------------------------------------------------------------------------------------------------- Assessment and Plan  Otitis media -Continue OTC analgesics as needed -Start augmentin x10 day.  -Call for new or worsening symptoms.

## 2019-09-12 ENCOUNTER — Emergency Department (HOSPITAL_COMMUNITY)
Admission: EM | Admit: 2019-09-12 | Discharge: 2019-09-12 | Discharge status: Against Medical Advice | Payer: Medicaid Other

## 2019-09-12 NOTE — ED Notes (Signed)
Unable to locate patient multiple times by staff.  

## 2019-09-13 ENCOUNTER — Ambulatory Visit: Payer: Medicaid Other | Admitting: Family Medicine

## 2019-09-13 ENCOUNTER — Other Ambulatory Visit: Payer: Self-pay

## 2019-09-13 ENCOUNTER — Encounter: Payer: Self-pay | Admitting: Family Medicine

## 2019-09-13 DIAGNOSIS — H66001 Acute suppurative otitis media without spontaneous rupture of ear drum, right ear: Secondary | ICD-10-CM

## 2019-09-13 MED ORDER — NAPROXEN 500 MG PO TABS: 500.0000 mg | ORAL_TABLET | Freq: Two times a day (BID) | ORAL | 0 refills | Status: DC

## 2019-09-13 NOTE — Progress Notes (Signed)
Melissa Farmer - 23 y.o. female MRN [id number]  Date of birth: 1996/07/24  Subjective Chief Complaint  Patient presents with  . Otalgia    pt still having right ear pain that worsen and hurts more each time she swallow    HPI Melissa Farmer is a 23 y.o. female here today for f/u of R ear pain.  Seen last week and dx with otitis media and started on augmentin.  Reports that she is taking augmentin and she thinks it has improved some but still painful when swallowing.  She denies fever, chills, headache or dizziness.  She has not noticed rash on the ear or face.  She denies drainage from the ear.  She has some improvement with ibuprofen.    ROS:  A comprehensive ROS was completed and negative except as noted per HPI  No Known Allergies  Past Medical History:  Diagnosis Date  . Medical history non-contributory     Past Surgical History:  Procedure Laterality Date  . NO PAST SURGERIES      Social History   Socioeconomic History  . Marital status: Single    Spouse name: Not on file  . Number of children: Not on file  . Years of education: Not on file  . Highest education level: Not on file  Occupational History  . Not on file  Social Needs  . Financial resource strain: Not hard at all  . Food insecurity    Worry: Never true    Inability: Never true  . Transportation needs    Medical: No    Non-medical: Not on file  Tobacco Use  . Smoking status: Never Smoker  . Smokeless tobacco: Never Used  Substance and Sexual Activity  . Alcohol use: No    Alcohol/week: 0.0 standard drinks  . Drug use: No  . Sexual activity: Yes    Birth control/protection: None  Lifestyle  . Physical activity    Days per week: Not on file    Minutes per session: Not on file  . Stress: Not at all  Relationships  . Social Herbalist on phone: Not on file    Gets together: Not on file    Attends religious service: Not on file    Active member of club or organization: Not  on file    Attends meetings of clubs or organizations: Not on file    Relationship status: Not on file  Other Topics Concern  . 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    Family History  Problem Relation Age of Onset  . Kidney disease Maternal Grandmother   . Stroke Maternal Grandfather   . Diabetes Maternal Grandfather   . Heart disease Maternal Grandfather   . Cancer Paternal Grandmother   . Asthma Paternal Grandmother   . Asthma Father     Health Maintenance  Topic Date Due  . PAP-Cervical Cytology Screening  07/21/2019  . CHLAMYDIA SCREENING  09/08/2019  . PAP SMEAR-Modifier  07/21/2019  . TETANUS/TDAP  06/28/2028  . INFLUENZA VACCINE  Completed  . HIV Screening  Completed    ----------------------------------------------------------------------------------------------------------------------------------------------------------------------------------------------------------------- Physical Exam BP 94/64   Pulse 94   Temp 98.3 F (36.8 C) (Tympanic)   Ht 5\' 6"  (1.676 m)   Wt 108 lb 3.2 oz (49.1 kg)   SpO2 98%   BMI 17.46 kg/m   Physical Exam Constitutional:      Appearance: Normal  appearance.  HENT:     Head: Normocephalic and atraumatic.     Right Ear: Ear canal normal.     Left Ear: Tympanic membrane and ear canal normal.     Ears:     Comments: R TM remains mildly erythematous, improved some since last check.     Mouth/Throat:     Mouth: Mucous membranes are moist.  Eyes:     General: No scleral icterus. Neck:     Musculoskeletal: Neck supple.  Cardiovascular:     Rate and Rhythm: Normal rate and regular rhythm.  Pulmonary:     Effort: Pulmonary effort is normal.     Breath sounds: Normal breath sounds.  Lymphadenopathy:     Cervical: Cervical adenopathy present.  Skin:    General: Skin is warm and dry.     Findings: No rash.  Neurological:     General: No focal deficit present.      Mental Status: She is alert.  Psychiatric:        Mood and Affect: Mood normal.        Behavior: Behavior normal.     ------------------------------------------------------------------------------------------------------------------------------------------------------------------------------------------------------------------- Assessment and Plan  Otitis media -Complete course of augmentin -Will add on naproxen 500mg  BID prn for pain -Recommend that she call or f/u if not resolved after completion of antibiotic.

## 2019-09-13 NOTE — Assessment & Plan Note (Signed)
-  Complete course of augmentin -Will add on naproxen 500mg  BID prn for pain -Recommend that she call or f/u if not resolved after completion of antibiotic.

## 2019-09-14 ENCOUNTER — Other Ambulatory Visit (HOSPITAL_COMMUNITY)
Admission: RE | Admit: 2019-09-14 | Discharge: 2019-09-14 | Disposition: A | Discharge status: Home / Self Care | Payer: Medicaid Other | Source: Ambulatory Visit | Attending: Obstetrics and Gynecology | Admitting: Obstetrics and Gynecology

## 2019-09-14 ENCOUNTER — Ambulatory Visit (INDEPENDENT_AMBULATORY_CARE_PROVIDER_SITE_OTHER): Payer: Medicaid Other

## 2019-09-14 DIAGNOSIS — Z113 Encounter for screening for infections with a predominantly sexual mode of transmission: Secondary | ICD-10-CM | POA: Diagnosis not present

## 2019-09-14 NOTE — Progress Notes (Addendum)
Pt here today for STD screening. Requests self-swab and blood work. Pt denies any symptoms or concern for STD. Self-swab instructions given and specimen obtained. Lab orders placed and labs drawn during visit.  [place] office to schedule provider visit for PAP and to discuss birth control.  Apolonio Schneiders RN 09/14/19   Patient seen and assessed by nursing staff during this encounter. I have reviewed the chart and agree with the documentation and plan.  Clarisa Fling, NP 09/14/2019 4:15 PM

## 2019-09-15 LAB — RPR: RPR Ser Ql: NONREACTIVE

## 2019-09-15 LAB — CERVICOVAGINAL ANCILLARY ONLY
Bacterial Vaginitis (gardnerella): NEGATIVE
Candida Glabrata: NEGATIVE
Candida Vaginitis: NEGATIVE
Chlamydia: NEGATIVE
Comment: NEGATIVE
Comment: NEGATIVE
Comment: NEGATIVE
Comment: NEGATIVE
Comment: NEGATIVE
Comment: NORMAL
Neisseria Gonorrhea: NEGATIVE
Trichomonas: NEGATIVE

## 2019-09-15 LAB — HIV ANTIBODY (ROUTINE TESTING W REFLEX): HIV Screen 4th Generation wRfx: NONREACTIVE

## 2019-09-15 LAB — HEPATITIS C ANTIBODY: Hep C Virus Ab: <0.1 s/co ratio (ref 0.0–0.9)

## 2019-09-15 LAB — HEPATITIS B SURFACE ANTIGEN: Hepatitis B Surface Ag: NEGATIVE

## 2019-09-28 ENCOUNTER — Ambulatory Visit (HOSPITAL_COMMUNITY)
Admission: EM | Admit: 2019-09-28 | Discharge: 2019-09-28 | Disposition: A | Discharge status: Home / Self Care | Payer: Medicaid Other | Attending: Family Medicine | Admitting: Family Medicine

## 2019-09-28 ENCOUNTER — Other Ambulatory Visit: Payer: Self-pay

## 2019-09-28 ENCOUNTER — Encounter (HOSPITAL_COMMUNITY): Payer: Self-pay

## 2019-09-28 DIAGNOSIS — N3090 Cystitis, unspecified without hematuria: Secondary | ICD-10-CM | POA: Diagnosis not present

## 2019-09-28 LAB — POCT URINALYSIS DIP (DEVICE)
Bilirubin Urine: NEGATIVE
Glucose, UA: NEGATIVE mg/dL
Ketones, ur: NEGATIVE mg/dL
Nitrite: NEGATIVE
Protein, ur: 100 mg/dL — AB
Specific Gravity, Urine: 1.025 (ref 1.005–1.030)
Urobilinogen, UA: 0.2 mg/dL (ref 0.0–1.0)
pH: 7 (ref 5.0–8.0)

## 2019-09-28 MED ORDER — CEPHALEXIN 500 MG PO CAPS: 500.0000 mg | ORAL_CAPSULE | Freq: Two times a day (BID) | ORAL | 0 refills | Status: DC

## 2019-09-28 NOTE — ED Triage Notes (Signed)
Pt presents to UC w/ c/o dysuria x2 days. Pt is currently on her menstrual period.

## 2019-09-28 NOTE — ED Provider Notes (Signed)
[facility]    ASSESSMENT & PLAN:  1. Cystitis     To begin: Meds ordered this encounter  Medications  . cephALEXin (KEFLEX) 500 MG capsule    Sig: Take 1 capsule (500 mg total) by mouth 2 (two) times daily.    Dispense:  10 capsule    Refill:  0    No signs of pyelonephritis. Discussed. Does not desire STI screening. Urine culture sent. Will notify patient when results available. Will follow up with her PCP or here if not showing improvement over the next 48 hours, sooner if needed.  Outlined signs and symptoms indicating need for more acute intervention. Patient verbalized understanding. After Visit Summary given.  SUBJECTIVE:  Melissa Farmer is a 23 y.o. female who complains of urinary frequency, urgency and dysuria for the past 2 days. Without associated flank pain, fever, chills, vaginal discharge or bleeding. Gross hematuria: present but on menstrual period. No specific aggravating or alleviating factors reported. No LE edema. Normal PO intake without n/v/d. Without specific abdominal pain. Ambulatory without difficulty. OTC treatment: none. H/O UTI: none reported.  LMP: Patient's last menstrual period was 09/22/2019.  ROS: As in HPI. All other systems negative.   OBJECTIVE:  Vitals:   09/28/19 1029  BP: 108/72  Pulse: 65  Resp: 16  Temp: 97.8 F (36.6 C)  TempSrc: Temporal  SpO2: 100%   General appearance: alert; no distress HENT: oropharynx: moist Lungs: unlabored respirations Abdomen: soft, non-tender; bowel sounds normal; no masses or organomegaly; no guarding or rebound tenderness Back: no CVA tenderness Extremities: no edema; symmetrical with no gross deformities Skin: warm and dry Neurologic: normal gait Psychological: alert and cooperative; normal mood and affect  Labs Reviewed  POCT URINALYSIS DIP (DEVICE) - Abnormal; Notable for the following components:      Result Value   Hgb urine dipstick LARGE (*)    Protein, ur 100  (*)    Leukocytes,Ua LARGE (*)    All other components within normal limits  URINE CULTURE    No Known Allergies  Past Medical History:  Diagnosis Date  . Medical history non-contributory    Social History   Socioeconomic History  . Marital status: Single    Spouse name: Not on file  . Number of children: Not on file  . Years of education: Not on file  . Highest education level: Not on file  Occupational History  . Not on file  Social Needs  . Financial resource strain: Not hard at all  . Food insecurity    Worry: Never true    Inability: Never true  . Transportation needs    Medical: No    Non-medical: Not on file  Tobacco Use  . Smoking status: Never Smoker  . Smokeless tobacco: Never Used  Substance and Sexual Activity  . Alcohol use: No    Alcohol/week: 0.0 standard drinks  . Drug use: No  . Sexual activity: Yes    Birth control/protection: None  Lifestyle  . Physical activity    Days per week: Not on file    Minutes per session: Not on file  . Stress: Not at all  Relationships  . Social Musician on phone: Not on file    Gets together: Not on file    Attends religious service: Not on file    Active member of club or organization: Not on file    Attends meetings of clubs or organizations: Not on file  Relationship status: Not on file  . Intimate partner violence    Fear of current or ex partner: No    Emotionally abused: No    Physically abused: No    Forced sexual activity: No  Other Topics Concern  . 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   Family History  Problem Relation Age of Onset  . Kidney disease Maternal Grandmother   . Stroke Maternal Grandfather   . Diabetes Maternal Grandfather   . Heart disease Maternal Grandfather   . Cancer Paternal Grandmother   . Asthma Paternal Grandmother   . Asthma Father        Vanessa Kick, MD 09/29/19 1014

## 2019-09-30 ENCOUNTER — Telehealth: Payer: Self-pay | Admitting: Emergency Medicine

## 2019-09-30 LAB — URINE CULTURE: Culture: >=100000 — AB

## 2019-09-30 NOTE — Telephone Encounter (Signed)
Urine culture was positive for KLEBSIELLA PNEUMONIAE and was given keflex  at urgent care visit. Attempted to reach patient. No answer at this time.  

## 2019-11-07 ENCOUNTER — Other Ambulatory Visit: Payer: Medicaid Other

## 2019-11-08 ENCOUNTER — Other Ambulatory Visit: Payer: Medicaid Other

## 2019-11-14 ENCOUNTER — Ambulatory Visit: Payer: Medicaid Other | Admitting: Nurse Practitioner

## 2019-11-24 ENCOUNTER — Encounter (HOSPITAL_COMMUNITY): Payer: Self-pay

## 2019-11-24 ENCOUNTER — Other Ambulatory Visit: Payer: Self-pay

## 2019-11-24 ENCOUNTER — Ambulatory Visit (HOSPITAL_COMMUNITY)
Admission: EM | Admit: 2019-11-24 | Discharge: 2019-11-24 | Disposition: A | Discharge status: Home / Self Care | Payer: Medicaid Other | Attending: Urgent Care | Admitting: Urgent Care

## 2019-11-24 DIAGNOSIS — N760 Acute vaginitis: Secondary | ICD-10-CM | POA: Diagnosis not present

## 2019-11-24 DIAGNOSIS — N898 Other specified noninflammatory disorders of vagina: Secondary | ICD-10-CM

## 2019-11-24 MED ORDER — FLUCONAZOLE 150 MG PO TABS: 150.0000 mg | ORAL_TABLET | ORAL | 0 refills | Status: DC

## 2019-11-24 NOTE — ED Provider Notes (Signed)
[facility]   MRN: [id number] DOB: Apr 18, 1996  Subjective:   Melissa Farmer is a 24 y.o. female presenting for 2-day history of acute onset persistent and worsening white clumpy vaginal discharge.  Patient has a history of yeast infections.  She states that a month ago she got STI testing and all was negative.  Patient is not interested in pregnancy test, has been taking oral birth control.  No current facility-administered medications for this encounter.  Current Outpatient Medications:  .  naproxen (NAPROSYN) 500 MG tablet, Take 1 tablet (500 mg total) by mouth 2 (two) times daily with a meal., Disp: 30 tablet, Rfl: 0 .  norgestimate-ethinyl estradiol (ORTHO-CYCLEN) 0.25-35 MG-MCG tablet, Take 1 tablet by mouth daily., Disp: 1 Package, Rfl: 3 .  norgestimate-ethinyl estradiol (ORTHO-CYCLEN) 0.25-35 MG-MCG tablet, Take 1 tablet by mouth daily., Disp: 1 Package, Rfl: 3   No Known Allergies  Past Medical History:  Diagnosis Date  . Medical history non-contributory      Past Surgical History:  Procedure Laterality Date  . NO PAST SURGERIES      Family History  Problem Relation Age of Onset  . Kidney disease Maternal Grandmother   . Stroke Maternal Grandfather   . Diabetes Maternal Grandfather   . Heart disease Maternal Grandfather   . Cancer Paternal Grandmother   . Asthma Paternal Grandmother   . Asthma Father     Social History   Tobacco Use  . Smoking status: Never Smoker  . Smokeless tobacco: Never Used  Substance Use Topics  . Alcohol use: No    Alcohol/week: 0.0 standard drinks  . Drug use: No    Review of Systems  Constitutional: Negative for chills and fever.  Respiratory: Negative for shortness of breath.   Cardiovascular: Negative for chest pain.  Gastrointestinal: Negative for abdominal pain, nausea and vomiting.  Genitourinary: Negative for dysuria, flank pain, frequency, hematuria and urgency.  Musculoskeletal: Negative for myalgias.    Skin: Negative for rash.  Neurological: Negative for dizziness and headaches.     Objective:   Vitals: BP 111/73 (BP Location: Right Arm)   Pulse (!) 105   Temp 98.9 F (37.2 C) (Oral)   Resp 16   Wt 107 lb (48.5 kg)   LMP 10/31/2019   SpO2 98%   BMI 17.27 kg/m   Physical Exam Constitutional:      General: She is not in acute distress.    Appearance: Normal appearance. She is well-developed. She is not ill-appearing, toxic-appearing or diaphoretic.  HENT:     Head: Normocephalic and atraumatic.     Nose: Nose normal.     Mouth/Throat:     Mouth: Mucous membranes are moist.     Pharynx: Oropharynx is clear.  Eyes:     General: No scleral icterus.    Extraocular Movements: Extraocular movements intact.     Pupils: Pupils are equal, round, and reactive to light.  Cardiovascular:     Rate and Rhythm: Normal rate.  Pulmonary:     Effort: Pulmonary effort is normal.  Skin:    General: Skin is warm and dry.  Neurological:     General: No focal deficit present.     Mental Status: She is alert and oriented to person, place, and time.  Psychiatric:        Mood and Affect: Mood normal.        Behavior: Behavior normal.      Assessment and Plan :   1. Acute vaginitis  2. Vaginal discharge     Patient is to start Diflucan to cover for yeast vaginitis, labs pending. Counseled patient on potential for adverse effects with medications prescribed/recommended today, ER and return-to-clinic precautions discussed, patient verbalized understanding.    Jaynee Eagles, PA-C 11/24/19 1059

## 2019-11-24 NOTE — ED Triage Notes (Signed)
Pt she has a yeast infection , pt states she has vaginal discharge white in color.

## 2019-11-28 LAB — CERVICOVAGINAL ANCILLARY ONLY
Bacterial vaginitis: NEGATIVE
Candida vaginitis: POSITIVE — AB
Chlamydia: NEGATIVE
Neisseria Gonorrhea: NEGATIVE
Trichomonas: NEGATIVE

## 2019-12-26 ENCOUNTER — Ambulatory Visit (INDEPENDENT_AMBULATORY_CARE_PROVIDER_SITE_OTHER): Payer: Medicaid Other | Admitting: Nurse Practitioner

## 2019-12-26 ENCOUNTER — Encounter: Payer: Self-pay | Admitting: Nurse Practitioner

## 2019-12-26 ENCOUNTER — Other Ambulatory Visit: Payer: Self-pay

## 2019-12-26 ENCOUNTER — Other Ambulatory Visit (HOSPITAL_COMMUNITY)
Admission: RE | Admit: 2019-12-26 | Discharge: 2019-12-26 | Disposition: A | Discharge status: Home / Self Care | Payer: Medicaid Other | Source: Ambulatory Visit | Attending: Nurse Practitioner | Admitting: Nurse Practitioner

## 2019-12-26 VITALS — BP 113/67 | HR 57 | Ht 66.0 in | Wt 107.7 lb

## 2019-12-26 DIAGNOSIS — Z01419 Encounter for gynecological examination (general) (routine) without abnormal findings: Secondary | ICD-10-CM | POA: Diagnosis present

## 2019-12-26 DIAGNOSIS — R636 Underweight: Secondary | ICD-10-CM

## 2019-12-26 DIAGNOSIS — Z3041 Encounter for surveillance of contraceptive pills: Secondary | ICD-10-CM

## 2019-12-26 DIAGNOSIS — Z0000 Encounter for general adult medical examination without abnormal findings: Secondary | ICD-10-CM | POA: Diagnosis not present

## 2019-12-26 MED ORDER — NORGESTIMATE-ETH ESTRADIOL 0.25-35 MG-MCG PO TABS: 1.0000 | ORAL_TABLET | Freq: Every day | ORAL | 11 refills | Status: DC

## 2019-12-26 NOTE — Progress Notes (Signed)
GYNECOLOGY ANNUAL PREVENTATIVE CARE ENCOUNTER NOTE  Subjective:   Melissa Farmer is a 24 y.o. G51P1001 female here for a routine annual gynecologic exam.  Current complaints: needs pills refilled.   Denies abnormal vaginal bleeding, discharge, pelvic pain, problems with intercourse or other gynecologic concerns.    Gynecologic History Patient's last menstrual period was 12/09/2019 (exact date). Contraception: OCP (estrogen/progesterone)  Obstetric History OB History  Gravida Para Term Preterm AB Living  1 1 1     1   SAB TAB Ectopic Multiple Live Births        0 1    # Outcome Date GA Lbr Len/2nd Weight Sex Delivery Anes PTL Lv  1 Term 09/22/18 [redacted]w[redacted]d  6 lb 9.6 oz (2.995 kg) F Vag-Spont None  LIV    Past Medical History:  Diagnosis Date  . Medical history non-contributory     Past Surgical History:  Procedure Laterality Date  . NO PAST SURGERIES      Current Outpatient Medications on File Prior to Visit  Medication Sig Dispense Refill  . naproxen (NAPROSYN) 500 MG tablet Take 1 tablet (500 mg total) by mouth 2 (two) times daily with a meal. 30 tablet 0   No current facility-administered medications on file prior to visit.    No Known Allergies  Social History   Socioeconomic History  . Marital status: Single    Spouse name: Not on file  . Number of children: Not on file  . Years of education: Not on file  . Highest education level: Not on file  Occupational History  . Not on file  Tobacco Use  . Smoking status: Never Smoker  . Smokeless tobacco: Never Used  Substance and Sexual Activity  . Alcohol use: No    Alcohol/week: 0.0 standard drinks  . Drug use: No  . Sexual activity: Yes    Birth control/protection: None  Other Topics Concern  . 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   Social Determinants of Health   Financial Resource Strain:   . Difficulty of Paying Living Expenses: Not on  file  Food Insecurity:   . Worried About Charity fundraiser in the Last Year: Not on file  . Ran Out of Food in the Last Year: Not on file  Transportation Needs:   . Lack of Transportation (Medical): Not on file  . Lack of Transportation (Non-Medical): Not on file  Physical Activity:   . Days of Exercise per Week: Not on file  . Minutes of Exercise per Session: Not on file  Stress:   . Feeling of Stress : Not on file  Social Connections:   . Frequency of Communication with Friends and Family: Not on file  . Frequency of Social Gatherings with Friends and Family: Not on file  . Attends Religious Services: Not on file  . Active Member of Clubs or Organizations: Not on file  . Attends Archivist Meetings: Not on file  . Marital Status: Not on file  Intimate Partner Violence:   . Fear of Current or Ex-Partner: Not on file  . Emotionally Abused: Not on file  . Physically Abused: Not on file  . Sexually Abused: Not on file    Family History  Problem Relation Age of Onset  . Kidney disease Maternal Grandmother   . Stroke Maternal Grandfather   . Diabetes Maternal Grandfather   . Heart disease Maternal Grandfather   .  Cancer Paternal Grandmother   . Asthma Paternal Grandmother   . Asthma Father     The following portions of the patient's history were reviewed and updated as appropriate: allergies, current medications, past family history, past medical history, past social history, past surgical history and problem list.  Review of Systems Pertinent items noted in HPI and remainder of comprehensive ROS otherwise negative.   Objective:  BP 113/67   Pulse (!) 57   Ht 5\' 6"  (1.676 m)   Wt 107 lb 11.2 oz (48.9 kg)   LMP 12/09/2019 (Exact Date)   BMI 17.38 kg/m  CONSTITUTIONAL: Well-developed, well-nourished female in no acute distress.  HENT:  Normocephalic, atraumatic, External right and left ear normal.  EYES: Conjunctivae and EOM are normal. Pupils are equal,  round.  No scleral icterus.  NECK: Normal range of motion, supple, no masses.  Normal thyroid.  SKIN: Skin is warm and dry. No rash noted. Not diaphoretic. No erythema. No pallor. NEUROLOGIC: Alert and oriented to person, place, and time. Normal reflexes, muscle tone coordination. No cranial nerve deficit noted. PSYCHIATRIC: Normal mood and affect. Normal behavior. Normal judgment and thought content. CARDIOVASCULAR: Normal heart rate noted, regular rhythm RESPIRATORY: Clear to auscultation bilaterally. Effort and breath sounds normal, no problems with respiration noted. BREASTS: Symmetric in size. No masses, skin changes, nipple drainage, or lymphadenopathy. ABDOMEN: Soft, no distention noted.  No tenderness, rebound or guarding.  PELVIC: Normal appearing external genitalia; normal appearing vaginal mucosa and cervix.  No abnormal discharge noted.  Pap smear obtained.  Normal uterine size, no other palpable masses, no uterine or adnexal tenderness. MUSCULOSKELETAL: Normal range of motion. No tenderness.  No cyanosis, clubbing, or edema.    Assessment and Plan:  1. Encounter for annual routine gynecological examination First pap smear today - tolerated well - Cytology - PAP( [facility])  2. Underweight Discussed eating snacks in addition to the 3 meals a day she usually has  3.  Supervision of oral contraceptives Refill given for one year  Will follow up results of pap smear and manage accordingly. Routine preventative health maintenance measures emphasized. Please refer to After Visit Summary for other counseling recommendations.    12/11/2019, RN, MSN, NP-BC Nurse Practitioner, [facility] for [facility]

## 2019-12-27 LAB — CYTOLOGY - PAP
Chlamydia: NEGATIVE
Comment: NEGATIVE
Comment: NEGATIVE
Comment: NORMAL
Diagnosis: NEGATIVE
Neisseria Gonorrhea: NEGATIVE
Trichomonas: NEGATIVE

## 2020-01-11 ENCOUNTER — Encounter (HOSPITAL_COMMUNITY): Payer: Self-pay | Admitting: Emergency Medicine

## 2020-01-11 ENCOUNTER — Ambulatory Visit (HOSPITAL_COMMUNITY)
Admission: EM | Admit: 2020-01-11 | Discharge: 2020-01-11 | Disposition: A | Discharge status: Home / Self Care | Payer: Medicaid Other | Attending: Family Medicine | Admitting: Family Medicine

## 2020-01-11 ENCOUNTER — Other Ambulatory Visit: Payer: Self-pay

## 2020-01-11 DIAGNOSIS — Z3202 Encounter for pregnancy test, result negative: Secondary | ICD-10-CM

## 2020-01-11 DIAGNOSIS — N898 Other specified noninflammatory disorders of vagina: Secondary | ICD-10-CM | POA: Diagnosis not present

## 2020-01-11 LAB — POCT PREGNANCY, URINE: Preg Test, Ur: NEGATIVE

## 2020-01-11 LAB — POC URINE PREG, ED: Preg Test, Ur: NEGATIVE

## 2020-01-11 MED ORDER — NYSTATIN-TRIAMCINOLONE 100000-0.1 UNIT/GM-% EX CREA: TOPICAL_CREAM | CUTANEOUS | 0 refills | Status: DC

## 2020-01-11 MED ORDER — FLUCONAZOLE 150 MG PO TABS: 150.0000 mg | ORAL_TABLET | Freq: Every day | ORAL | 0 refills | Status: DC

## 2020-01-11 NOTE — ED Provider Notes (Signed)
[facility]    CSN: [id number] Arrival date & time: 01/11/20  1019      History   Chief Complaint Chief Complaint  Patient presents with  . Vaginal Discharge  . Possible Pregnancy    HPI Melissa Farmer is a 24 y.o. female.   Patient is a 24 year old female presents today with approximate 2 days of vaginal discharge and vaginal irritation.  Symptoms been constant.  Describes the discharge as thick, yellow.  No abdominal pain, back pain or fevers.  She is on oral contraceptives for birth control.  Reporting approximately 5 days late for her menstrual cycle.  Currently sexually active but not specifically concerned for STDs. Patient's last menstrual period was 12/09/2019 (exact date).  ROS per HPI    Vaginal Discharge Possible Pregnancy    Past Medical History:  Diagnosis Date  . Medical history non-contributory     Patient Active Problem List   Diagnosis Date Noted  . Otitis media 09/09/2019  . Headache 03/26/2016    Past Surgical History:  Procedure Laterality Date  . NO PAST SURGERIES      OB History    Gravida  1   Para  1   Term  1   Preterm      AB      Living  1     SAB      TAB      Ectopic      Multiple  0   Live Births  1            Home Medications    Prior to Admission medications   Medication Sig Start Date End Date Taking? Authorizing Provider  norgestimate-ethinyl estradiol (ORTHO-CYCLEN) 0.25-35 MG-MCG tablet Take 1 tablet by mouth daily. 12/26/19  Yes Burleson, Terri L, NP  fluconazole (DIFLUCAN) 150 MG tablet Take 1 tablet (150 mg total) by mouth daily. 01/11/20   Loura Halt A, NP  naproxen (NAPROSYN) 500 MG tablet Take 1 tablet (500 mg total) by mouth 2 (two) times daily with a meal. 09/13/19   Luetta Nutting, DO  nystatin-triamcinolone [facility] II) cream Apply to affected area daily 01/11/20   Orvan July, NP    Family History Family History  Problem Relation Age of Onset  . Kidney disease  Maternal Grandmother   . Stroke Maternal Grandfather   . Diabetes Maternal Grandfather   . Heart disease Maternal Grandfather   . Cancer Paternal Grandmother   . Asthma Paternal Grandmother   . Asthma Father     Social History Social History   Tobacco Use  . Smoking status: Never Smoker  . Smokeless tobacco: Never Used  Substance Use Topics  . Alcohol use: No    Alcohol/week: 0.0 standard drinks  . Drug use: No     Allergies   Patient has no known allergies.   Review of Systems Review of Systems  Genitourinary: Positive for vaginal discharge.     Physical Exam Triage Vital Signs ED Triage Vitals  Enc Vitals Group     BP 01/11/20 1047 126/86     Pulse Rate 01/11/20 1047 (!) 58     Resp 01/11/20 1047 14     Temp 01/11/20 1047 98.4 F (36.9 C)     Temp Source 01/11/20 1047 Oral     SpO2 01/11/20 1047 98 %     Weight --      Height --      Head Circumference --  Peak Flow --      Pain Score 01/11/20 1049 0     Pain Loc --      Pain Edu? --      Excl. in [place]? --    No data found.  Updated Vital Signs BP 126/86 (BP Location: Left Arm)   Pulse (!) 58   Temp 98.4 F (36.9 C) (Oral)   Resp 14   LMP 12/09/2019 (Exact Date)   SpO2 98%   Visual Acuity Right Eye Distance:   Left Eye Distance:   Bilateral Distance:    Right Eye Near:   Left Eye Near:    Bilateral Near:     Physical Exam Vitals and nursing note reviewed.  Constitutional:      General: She is not in acute distress.    Appearance: Normal appearance. She is not ill-appearing, toxic-appearing or diaphoretic.  HENT:     Head: Normocephalic.     Nose: Nose normal.  Eyes:     Conjunctiva/sclera: Conjunctivae normal.  Pulmonary:     Effort: Pulmonary effort is normal.  Musculoskeletal:        General: Normal range of motion.     Cervical back: Normal range of motion.  Skin:    General: Skin is warm and dry.     Findings: No rash.  Neurological:     Mental Status: She is alert.    Psychiatric:        Mood and Affect: Mood normal.      UC Treatments / Results  Labs (all labs ordered are listed, but only abnormal results are displayed) Labs Reviewed  POCT PREGNANCY, URINE  POC URINE PREG, ED  CERVICOVAGINAL ANCILLARY ONLY    EKG   Radiology No results found.  Procedures Procedures (including critical care time)  Medications Ordered in UC Medications - No data to display  Initial Impression / Assessment and Plan / UC Course  I have reviewed the triage vital signs and the nursing notes.  Pertinent labs & imaging results that were available during my care of the patient were reviewed by me and considered in my medical decision making (see chart for details).     Vaginal discharge-treating patient for yeast infection based on symptoms Self swab sent for testing labs pending. Pregnancy test negative Follow up as needed for continued or worsening symptoms  Final Clinical Impressions(s) / UC Diagnoses   Final diagnoses:  Vaginal discharge     Discharge Instructions     We are treating you for a yeast infection.  Sending swab for testing and will call with any positive results.  Pregnancy test negative.  Follow up as needed for continued or worsening symptoms     ED Prescriptions    Medication Sig Dispense Auth. Provider   nystatin-triamcinolone (MYCOLOG II) cream Apply to affected area daily 15 g Xzavier Swinger A, NP   fluconazole (DIFLUCAN) 150 MG tablet Take 1 tablet (150 mg total) by mouth daily. 2 tablet Dahlia Byes A, NP     PDMP not reviewed this encounter.   Janace Aris, NP 01/11/20 1143

## 2020-01-11 NOTE — ED Triage Notes (Signed)
Pt here for two days of vaginal discharge with itching.  Pt also states she is five days late for her period.  She does take oral contraceptives and has been taking them on time.

## 2020-01-11 NOTE — Discharge Instructions (Signed)
We are treating you for a yeast infection.  Sending swab for testing and will call with any positive results.  Pregnancy test negative.  Follow up as needed for continued or worsening symptoms

## 2020-01-16 LAB — CERVICOVAGINAL ANCILLARY ONLY
Bacterial vaginitis: NEGATIVE
Candida vaginitis: POSITIVE — AB
Chlamydia: NEGATIVE
Neisseria Gonorrhea: NEGATIVE
Trichomonas: NEGATIVE

## 2020-02-08 ENCOUNTER — Ambulatory Visit (HOSPITAL_COMMUNITY)
Admission: EM | Admit: 2020-02-08 | Discharge: 2020-02-08 | Disposition: A | Discharge status: Home / Self Care | Payer: Medicaid Other | Attending: Internal Medicine | Admitting: Internal Medicine

## 2020-02-08 ENCOUNTER — Encounter (HOSPITAL_COMMUNITY): Payer: Self-pay | Admitting: Emergency Medicine

## 2020-02-08 ENCOUNTER — Other Ambulatory Visit: Payer: Self-pay

## 2020-02-08 DIAGNOSIS — R197 Diarrhea, unspecified: Secondary | ICD-10-CM | POA: Diagnosis not present

## 2020-02-08 DIAGNOSIS — Z20822 Contact with and (suspected) exposure to covid-19: Secondary | ICD-10-CM | POA: Insufficient documentation

## 2020-02-08 DIAGNOSIS — R1030 Lower abdominal pain, unspecified: Secondary | ICD-10-CM | POA: Diagnosis not present

## 2020-02-08 DIAGNOSIS — R112 Nausea with vomiting, unspecified: Secondary | ICD-10-CM | POA: Insufficient documentation

## 2020-02-08 DIAGNOSIS — Z79899 Other long term (current) drug therapy: Secondary | ICD-10-CM | POA: Diagnosis not present

## 2020-02-08 DIAGNOSIS — Z3202 Encounter for pregnancy test, result negative: Secondary | ICD-10-CM

## 2020-02-08 LAB — POCT URINALYSIS DIP (DEVICE)
Bilirubin Urine: NEGATIVE
Glucose, UA: NEGATIVE mg/dL
Hgb urine dipstick: NEGATIVE
Ketones, ur: NEGATIVE mg/dL
Nitrite: NEGATIVE
Protein, ur: 30 mg/dL — AB
Specific Gravity, Urine: 1.02 (ref 1.005–1.030)
Urobilinogen, UA: 0.2 mg/dL (ref 0.0–1.0)
pH: 7 (ref 5.0–8.0)

## 2020-02-08 LAB — POCT PREGNANCY, URINE: Preg Test, Ur: NEGATIVE

## 2020-02-08 LAB — POC URINE PREG, ED: Preg Test, Ur: NEGATIVE

## 2020-02-08 MED ORDER — NAPROXEN 500 MG PO TABS: 500.0000 mg | ORAL_TABLET | Freq: Two times a day (BID) | ORAL | 0 refills | Status: DC

## 2020-02-08 MED ORDER — ONDANSETRON 4 MG PO TBDP: 4.0000 mg | ORAL_TABLET | Freq: Three times a day (TID) | ORAL | 0 refills | Status: DC | PRN

## 2020-02-08 NOTE — ED Provider Notes (Signed)
[facility]    CSN: [id number] Arrival date & time: 02/08/20  1700      History   Chief Complaint Chief Complaint  Patient presents with  . Diarrhea  . Abdominal Pain    HPI Melissa Farmer is a 24 y.o. female no significant past medical history presenting today for evaluation of nausea vomiting diarrhea and abdominal pain.  Patient notes that over the past 2 days she has had GI symptoms.  She states that the nausea and vomiting waxes and wanes, does not currently feel nauseous.  Feels more prominent in the morning.  Diarrhea is loose, approximately 3 bowel movements every 2-3 hours.  Denies blood in stool or vomit.  She also has had abdominal pain to her lower mid abdomen.  Feels worse with straining/laughing/certain movements.  Denies any urinary symptoms of dysuria, increased frequency or urgency.  Denies any abnormal vaginal discharge, but does report being sexually active.  Last menstrual cycle was around 3/1.  Is on oral contraceptives.  Denies any associated URI symptoms.  Denies fevers chills or body aches.  Denies any close sick contacts.  HPI  Past Medical History:  Diagnosis Date  . Medical history non-contributory     Patient Active Problem List   Diagnosis Date Noted  . Otitis media 09/09/2019  . Headache 03/26/2016    Past Surgical History:  Procedure Laterality Date  . NO PAST SURGERIES      OB History    Gravida  1   Para  1   Term  1   Preterm      AB      Living  1     SAB      TAB      Ectopic      Multiple  0   Live Births  1            Home Medications    Prior to Admission medications   Medication Sig Start Date End Date Taking? Authorizing Provider  norgestimate-ethinyl estradiol (ORTHO-CYCLEN) 0.25-35 MG-MCG tablet Take 1 tablet by mouth daily. 12/26/19  Yes Burleson, Terri L, NP  fluconazole (DIFLUCAN) 150 MG tablet Take 1 tablet (150 mg total) by mouth daily. 01/11/20   Dahlia Byes A, NP  naproxen  (NAPROSYN) 500 MG tablet Take 1 tablet (500 mg total) by mouth 2 (two) times daily. 02/08/20   ,  C, PA-C  nystatin-triamcinolone (MYCOLOG II) cream Apply to affected area daily 01/11/20   Dahlia Byes A, NP  ondansetron (ZOFRAN ODT) 4 MG disintegrating tablet Take 1 tablet (4 mg total) by mouth every 8 (eight) hours as needed for nausea or vomiting. 02/08/20   , Junius Creamer, PA-C    Family History Family History  Problem Relation Age of Onset  . Kidney disease Maternal Grandmother   . Stroke Maternal Grandfather   . Diabetes Maternal Grandfather   . Heart disease Maternal Grandfather   . Cancer Paternal Grandmother   . Asthma Paternal Grandmother   . Asthma Father     Social History Social History   Tobacco Use  . Smoking status: Never Smoker  . Smokeless tobacco: Never Used  Substance Use Topics  . Alcohol use: No    Alcohol/week: 0.0 standard drinks  . Drug use: No     Allergies   Patient has no known allergies.   Review of Systems Review of Systems  Constitutional: Negative for activity change, appetite change, chills, fatigue and fever.  HENT: Negative for  congestion, ear pain, rhinorrhea, sinus pressure, sore throat and trouble swallowing.   Eyes: Negative for discharge and redness.  Respiratory: Positive for cough. Negative for chest tightness and shortness of breath.   Cardiovascular: Negative for chest pain.  Gastrointestinal: Positive for abdominal pain, diarrhea, nausea and vomiting.  Genitourinary: Negative for dysuria, flank pain, genital sores, hematuria, menstrual problem, vaginal bleeding, vaginal discharge and vaginal pain.  Musculoskeletal: Negative for back pain and myalgias.  Skin: Negative for rash.  Neurological: Negative for dizziness, light-headedness and headaches.     Physical Exam Triage Vital Signs ED Triage Vitals  Enc Vitals Group     BP 02/08/20 1726 130/82     Pulse Rate 02/08/20 1726 86     Resp 02/08/20 1726 16      Temp 02/08/20 1726 99.4 F (37.4 C)     Temp Source 02/08/20 1726 Oral     SpO2 02/08/20 1726 100 %     Weight --      Height --      Head Circumference --      Peak Flow --      Pain Score 02/08/20 1727 8     Pain Loc --      Pain Edu? --      Excl. in [place]? --    No data found.  Updated Vital Signs BP 130/82   Pulse 86   Temp 99.4 F (37.4 C) (Oral)   Resp 16   LMP 01/16/2020   SpO2 100%   Visual Acuity Right Eye Distance:   Left Eye Distance:   Bilateral Distance:    Right Eye Near:   Left Eye Near:    Bilateral Near:     Physical Exam Vitals and nursing note reviewed.  Constitutional:      Appearance: She is well-developed.     Comments: No acute distress  HENT:     Head: Normocephalic and atraumatic.     Nose: Nose normal.     Mouth/Throat:     Comments: Oral mucosa pink and moist, no tonsillar enlargement or exudate. Posterior pharynx patent and nonerythematous, no uvula deviation or swelling. Normal phonation. Eyes:     Extraocular Movements: Extraocular movements intact.     Conjunctiva/sclera: Conjunctivae normal.     Pupils: Pupils are equal, round, and reactive to light.  Cardiovascular:     Rate and Rhythm: Normal rate.  Pulmonary:     Effort: Pulmonary effort is normal. No respiratory distress.     Comments: Breathing comfortably at rest, CTABL, no wheezing, rales or other adventitious sounds auscultated  Abdominal:     General: There is no distension.     Comments: Soft, nondistended, generalized tenderness throughout bilateral lower quadrants, no focal tenderness, negative rebound, negative Rovsing, negative McBurney's, negative Murphy's  Genitourinary:    Comments: deferred Musculoskeletal:        General: Normal range of motion.     Cervical back: Neck supple.  Skin:    General: Skin is warm and dry.  Neurological:     General: No focal deficit present.     Mental Status: She is alert and oriented to person, place, and time.      UC  Treatments / Results  Labs (all labs ordered are listed, but only abnormal results are displayed) Labs Reviewed  POCT URINALYSIS DIP (DEVICE) - Abnormal; Notable for the following components:      Result Value   Protein, ur 30 (*)    Leukocytes,Ua TRACE (*)  All other components within normal limits  SARS CORONAVIRUS 2 (TAT 6-24 HRS)  POC URINE PREG, ED  POCT PREGNANCY, URINE  CERVICOVAGINAL ANCILLARY ONLY    EKG   Radiology No results found.  Procedures Procedures (including critical care time)  Medications Ordered in UC Medications - No data to display  Initial Impression / Assessment and Plan / UC Course  I have reviewed the triage vital signs and the nursing notes.  Pertinent labs & imaging results that were available during my care of the patient were reviewed by me and considered in my medical decision making (see chart for details).     Pregnancy test negative, UA not suggestive of UTI.  Will obtain vaginal swab to screen for STDs.  Most likely nausea vomiting diarrhea and abdominal discomfort viral, would expect gradual self resolution.  Recommending symptomatic and supportive care.  Abdominal pain also suggestive of possible MSK etiology given triggered with straining and certain movements.  Zofran for nausea, rest and push fluids.  Screening for Covid.  Tylenol and ibuprofen for pain or may try Naprosyn as alternative if helping with pain.  Discussed strict return precautions. Patient verbalized understanding and is agreeable with plan.  Final Clinical Impressions(s) / UC Diagnoses   Final diagnoses:  Nausea vomiting and diarrhea  Lower abdominal pain     Discharge Instructions     Nausea vomiting and diarrhea most likely viral, I would expect this to gradually resolve over the next few days; please follow-up if symptoms persisting or worsening Covid swab pending Vaginal swab pending to screen for STDs  For nausea: Zofran prescribed. Begin with every 8  hours, than as you are able to hold food down, take it as needed. Start with clear liquids, then move to plain foods like bananas, rice, applesauce, toast, broth, grits, oatmeal. As those food settle okay you may transition to your normal foods. Avoid spicy and greasy foods as much as possible.  For Diarrhea: This is your body's natural way of getting rid of a virus. You may try taking 1 imodium to decrease amount of stools a day, but we do not want you to stop your diarrhea.   Preventing dehydration is key! You need to replace the fluid your body is expelling. Drink plenty of fluids, may use Pedialyte or sports drinks.   Please return if you are experiencing blood in your vomit or stool or experiencing dizziness, lightheadedness, extreme fatigue, increased abdominal pain.    ED Prescriptions    Medication Sig Dispense Auth. Provider   ondansetron (ZOFRAN ODT) 4 MG disintegrating tablet Take 1 tablet (4 mg total) by mouth every 8 (eight) hours as needed for nausea or vomiting. 20 tablet ,  C, PA-C   naproxen (NAPROSYN) 500 MG tablet Take 1 tablet (500 mg total) by mouth 2 (two) times daily. 30 tablet , [place] C, PA-C     PDMP not reviewed this encounter.   Lew Dawes, [place] 02/08/20 1856

## 2020-02-08 NOTE — Discharge Instructions (Signed)
Nausea vomiting and diarrhea most likely viral, I would expect this to gradually resolve over the next few days; please follow-up if symptoms persisting or worsening Covid swab pending Vaginal swab pending to screen for STDs  For nausea: Zofran prescribed. Begin with every 8 hours, than as you are able to hold food down, take it as needed. Start with clear liquids, then move to plain foods like bananas, rice, applesauce, toast, broth, grits, oatmeal. As those food settle okay you may transition to your normal foods. Avoid spicy and greasy foods as much as possible.  For Diarrhea: This is your body's natural way of getting rid of a virus. You may try taking 1 imodium to decrease amount of stools a day, but we do not want you to stop your diarrhea.   Preventing dehydration is key! You need to replace the fluid your body is expelling. Drink plenty of fluids, may use Pedialyte or sports drinks.   Please return if you are experiencing blood in your vomit or stool or experiencing dizziness, lightheadedness, extreme fatigue, increased abdominal pain.

## 2020-02-08 NOTE — ED Triage Notes (Signed)
PT reports nausea, vomiting, diarrhea, and abdominal pain for 2 days. Vomiting is better, but diarrhea and pain are consistant

## 2020-02-09 LAB — CERVICOVAGINAL ANCILLARY ONLY
Bacterial vaginitis: NEGATIVE
Candida vaginitis: POSITIVE — AB
Chlamydia: NEGATIVE
Neisseria Gonorrhea: NEGATIVE
Trichomonas: NEGATIVE

## 2020-02-09 LAB — SARS CORONAVIRUS 2 (TAT 6-24 HRS): SARS Coronavirus 2: NEGATIVE

## 2020-02-10 ENCOUNTER — Telehealth (HOSPITAL_COMMUNITY): Payer: Self-pay

## 2020-02-10 MED ORDER — FLUCONAZOLE 150 MG PO TABS: 150.0000 mg | ORAL_TABLET | Freq: Every day | ORAL | 0 refills | Status: AC

## 2020-04-26 ENCOUNTER — Encounter: Payer: Self-pay | Admitting: Family Medicine

## 2020-04-26 NOTE — Telephone Encounter (Signed)
Not a patient here. Can you please forward to correct person?

## 2020-05-22 ENCOUNTER — Ambulatory Visit (INDEPENDENT_AMBULATORY_CARE_PROVIDER_SITE_OTHER): Payer: Medicaid Other | Admitting: Nurse Practitioner

## 2020-05-22 ENCOUNTER — Encounter: Payer: Self-pay | Admitting: Nurse Practitioner

## 2020-05-22 ENCOUNTER — Telehealth: Payer: Self-pay

## 2020-05-22 ENCOUNTER — Other Ambulatory Visit: Payer: Self-pay

## 2020-05-22 VITALS — BP 110/60 | HR 100 | Temp 96.4°F | Ht 66.0 in | Wt 108.4 lb

## 2020-05-22 DIAGNOSIS — F419 Anxiety disorder, unspecified: Secondary | ICD-10-CM | POA: Diagnosis not present

## 2020-05-22 DIAGNOSIS — R002 Palpitations: Secondary | ICD-10-CM | POA: Diagnosis not present

## 2020-05-22 DIAGNOSIS — M778 Other enthesopathies, not elsewhere classified: Secondary | ICD-10-CM

## 2020-05-22 LAB — CBC WITH DIFFERENTIAL/PLATELET
Basophils Absolute: 0 10*3/uL (ref 0.0–0.1)
Basophils Relative: 0.6 % (ref 0.0–3.0)
Eosinophils Absolute: 0 10*3/uL (ref 0.0–0.7)
Eosinophils Relative: 0.7 % (ref 0.0–5.0)
HCT: 36.9 % (ref 36.0–46.0)
Hemoglobin: 12.8 g/dL (ref 12.0–15.0)
Lymphocytes Relative: 36.1 % (ref 12.0–46.0)
Lymphs Abs: 0.9 10*3/uL (ref 0.7–4.0)
MCHC: 34.7 g/dL (ref 30.0–36.0)
MCV: 94.9 fl (ref 78.0–100.0)
Monocytes Absolute: 0.2 10*3/uL (ref 0.1–1.0)
Monocytes Relative: 6.2 % (ref 3.0–12.0)
Neutro Abs: 1.5 10*3/uL (ref 1.4–7.7)
Neutrophils Relative %: 56.4 % (ref 43.0–77.0)
Platelets: 169 10*3/uL (ref 150.0–400.0)
RBC: 3.89 Mil/uL (ref 3.87–5.11)
RDW: 12.3 % (ref 11.5–15.5)
WBC: 2.6 10*3/uL — ABNORMAL LOW (ref 4.0–10.5)

## 2020-05-22 LAB — BASIC METABOLIC PANEL
BUN: 12 mg/dL (ref 6–23)
CO2: 26 mEq/L (ref 19–32)
Calcium: 9 mg/dL (ref 8.4–10.5)
Chloride: 102 mEq/L (ref 96–112)
Creatinine, Ser: 0.68 mg/dL (ref 0.40–1.20)
GFR: 131.11 mL/min (ref >60.00)
Glucose, Bld: 80 mg/dL (ref 70–99)
Potassium: 3.5 mEq/L (ref 3.5–5.1)
Sodium: 137 mEq/L (ref 135–145)

## 2020-05-22 LAB — TSH: TSH: 1.63 u[IU]/mL (ref 0.35–4.50)

## 2020-05-22 MED ORDER — DICLOFENAC SODIUM 1 % EX GEL: 4.0000 g | Freq: Three times a day (TID) | CUTANEOUS | 0 refills | Status: DC

## 2020-05-22 NOTE — Patient Instructions (Addendum)
Go to lab for blood draw  Use left wrist brace daily for 2weeks Apply voltaren gel 3x/day Apply cold compress AM and PM ( each). If no improvement, will need referral to sport medicine. Work note provided.

## 2020-05-22 NOTE — Assessment & Plan Note (Signed)
Onset 54months ago, worsening, associated with chest pain and weakness, last for about 3-44mins, no syncope No known trigger, occurs with or without stress, with or without activity No nicotine or caffeine or ETOH or illicit drug use. Compliant with OCP use, LMP 04/24/2020.  Check CBC, BMP and TSH Will send buspar is labs are normal

## 2020-05-22 NOTE — Progress Notes (Signed)
Subjective:  Patient ID: Melissa Farmer, female    DOB: 1996/11/14  Age: 24 y.o. MRN: [id number]  CC: Establish Care (TOC from [facility]-anxiety and wrist pain//pt saids she has at least 2 panic attacks every week-feels like she can't catch her breathe and heart pounds//left wrist hurts for 8 or 9  months after injury-she lifted something an heard a pop//)  HPI  Left wrist tendonitis Onset 2months ago, intermittent No OTC medication of home remedy used. Due to repetitive movement at current job.   Use left wrist brace daily for 2weeks Apply voltaren gel 3x/day, rx sent Apply cold compress AM and PM ( each). If no improvement, will need referral to sport medicine. Work note provided.   Intermittent palpitations Onset 53months ago, worsening, associated with chest pain and weakness, last for about 3-57mins, no syncope No known trigger, occurs with or without stress, with or without activity No nicotine or caffeine or ETOH or illicit drug use. Compliant with OCP use, LMP 04/24/2020.  Check CBC, BMP and TSH Will send buspar is labs are normal   Reviewed past Medical, Social and Family history today.  Outpatient Medications Prior to Visit  Medication Sig Dispense Refill  . norgestimate-ethinyl estradiol (ORTHO-CYCLEN) 0.25-35 MG-MCG tablet Take 1 tablet by mouth daily. 1 Package 11  . fluconazole (DIFLUCAN) 150 MG tablet Take 1 tablet (150 mg total) by mouth daily. (Patient not taking: Reported on 05/22/2020) 2 tablet 0  . naproxen (NAPROSYN) 500 MG tablet Take 1 tablet (500 mg total) by mouth 2 (two) times daily. (Patient not taking: Reported on 05/22/2020) 30 tablet 0  . nystatin-triamcinolone (MYCOLOG II) cream Apply to affected area daily (Patient not taking: Reported on 05/22/2020) 15 g 0  . ondansetron (ZOFRAN ODT) 4 MG disintegrating tablet Take 1 tablet (4 mg total) by mouth every 8 (eight) hours as needed for nausea or vomiting. (Patient not taking: Reported on 05/22/2020) 20  tablet 0   No facility-administered medications prior to visit.    ROS See HPI  Objective:  BP 110/60   Pulse 100   Temp (!) 96.4 F (35.8 C) (Tympanic)   Ht 5\' 6"  (1.676 m)   Wt 108 lb 6.4 oz (49.2 kg)   LMP 04/24/2020 (Exact Date)   SpO2 98%   Breastfeeding No   BMI 17.50 kg/m   Physical Exam Vitals reviewed.  Cardiovascular:     Rate and Rhythm: Normal rate and regular rhythm.     Pulses: Normal pulses.     Heart sounds: Normal heart sounds.  Pulmonary:     Effort: Pulmonary effort is normal.     Breath sounds: Normal breath sounds.  Musculoskeletal:     Right elbow: Normal.     Left elbow: Normal.     Right forearm: Normal.     Left forearm: Normal.     Right wrist: Normal.     Left wrist: Tenderness present. No swelling, deformity, effusion, lacerations, bony tenderness, snuff box tenderness or crepitus. Normal range of motion. Normal pulse.     Right hand: Normal.     Left hand: Normal.     Comments: tenderness with medial deviation of wrist  Neurological:     Mental Status: She is alert.  Psychiatric:        Attention and Perception: Attention normal.        Mood and Affect: Mood is anxious.        Speech: Speech normal.        Behavior:  Behavior is cooperative.        Thought Content: Thought content normal.        Cognition and Memory: Cognition normal.        Judgment: Judgment normal.     Assessment & Plan:  This visit occurred during the SARS-CoV-2 public health emergency.  Safety protocols were in place, including screening questions prior to the visit, additional usage of staff PPE, and extensive cleaning of exam room while observing appropriate contact time as indicated for disinfecting solutions.   Brittyn was seen today for establish care.  Diagnoses and all orders for this visit:  Intermittent palpitations -     CBC w/Diff -     Basic metabolic panel -     TSH  Left wrist tendonitis -     diclofenac Sodium (VOLTAREN) 1 % GEL;  Apply 4 g topically 3 (three) times daily.    Problem List Items Addressed This Visit      Musculoskeletal and Integument   Left wrist tendonitis    Onset 10months ago, intermittent No OTC medication of home remedy used. Due to repetitive movement at current job.   Use left wrist brace daily for 2weeks Apply voltaren gel 3x/day, rx sent Apply cold compress AM and PM ( each). If no improvement, will need referral to sport medicine. Work note provided.       Relevant Medications   diclofenac Sodium (VOLTAREN) 1 % GEL     Other   Intermittent palpitations - Primary    Onset 74months ago, worsening, associated with chest pain and weakness, last for about 3-57mins, no syncope No known trigger, occurs with or without stress, with or without activity No nicotine or caffeine or ETOH or illicit drug use. Compliant with OCP use, LMP 04/24/2020.  Check CBC, BMP and TSH Will send buspar is labs are normal      Relevant Orders   CBC w/Diff   Basic metabolic panel   TSH      Follow-up: Return in about 4 weeks (around 06/19/2020) for CPE (fasting).  Alysia Penna, NP

## 2020-05-22 NOTE — Assessment & Plan Note (Signed)
Onset 62months ago, intermittent No OTC medication of home remedy used. Due to repetitive movement at current job.   Use left wrist brace daily for 2weeks Apply voltaren gel 3x/day, rx sent Apply cold compress AM and PM ( each). If no improvement, will need referral to sport medicine. Work note provided.

## 2020-05-22 NOTE — Telephone Encounter (Signed)
Melissa Farmer please advise.  Pt called asking if her work note that was given today 05/22/20 could be changed to asked for 1 wekk instead of 2 weeks cause she doesn't have enough PAL to cover 2 weeks.

## 2020-05-23 DIAGNOSIS — F419 Anxiety disorder, unspecified: Secondary | ICD-10-CM | POA: Insufficient documentation

## 2020-05-23 MED ORDER — BUSPIRONE HCL 7.5 MG PO TABS: 7.5000 mg | ORAL_TABLET | Freq: Two times a day (BID) | ORAL | 5 refills | Status: DC

## 2020-05-23 NOTE — Telephone Encounter (Signed)
Ok to write letter

## 2020-05-23 NOTE — Telephone Encounter (Signed)
She was not written out of work. Letter request for work modification for 2weeks. Has she been told, the modification can not be provided?

## 2020-05-23 NOTE — Assessment & Plan Note (Signed)
New, waxing and waning. Normal cbc, TSH and BMP.  Start buspar BID F/up in 49month

## 2020-05-23 NOTE — Telephone Encounter (Signed)
Charlotte please advise.  Pt was contacted an states that  her job doesn't have light duty where she works and sent her home immediately. So she will have to use PAL to be out and she only has enough to cover her for 7 days.

## 2020-05-23 NOTE — Telephone Encounter (Signed)
Letter rewrote for 7 days and sent on My Chart. Pt was told if she needed to pick up the sign copy to inform us so we can leave it up front for her, Pt thought the My Chart note would work.

## 2020-05-29 ENCOUNTER — Other Ambulatory Visit: Payer: Self-pay

## 2020-05-29 ENCOUNTER — Encounter: Payer: Self-pay | Admitting: Internal Medicine

## 2020-05-29 ENCOUNTER — Ambulatory Visit (INDEPENDENT_AMBULATORY_CARE_PROVIDER_SITE_OTHER): Payer: Medicaid Other | Admitting: Internal Medicine

## 2020-05-29 VITALS — BP 116/78 | HR 107 | Temp 98.2°F | Ht 66.0 in | Wt 107.0 lb

## 2020-05-29 DIAGNOSIS — M778 Other enthesopathies, not elsewhere classified: Secondary | ICD-10-CM | POA: Diagnosis not present

## 2020-05-29 MED ORDER — MELOXICAM 15 MG PO TABS: 15.0000 mg | ORAL_TABLET | Freq: Every day | ORAL | 0 refills | Status: DC

## 2020-05-29 MED ORDER — METHYLPREDNISOLONE ACETATE 40 MG/ML IJ SUSP
40.0000 mg | Freq: Once | INTRAMUSCULAR | Status: AC
  Administered 2020-05-29: 40 mg via INTRAMUSCULAR

## 2020-05-29 NOTE — Assessment & Plan Note (Signed)
Given work note, depo-medrol 40 mg IM as well as rx for meloxicam to take regularly for 1-2 weeks. Suspect more strain at home with lifting her daughter and other household chores.

## 2020-05-29 NOTE — Patient Instructions (Signed)
We have given you a shot today and sent in a daily medicine to help the inflammation called meloxicam. Take 1 pill daily of that for 1-2 weeks.

## 2020-05-29 NOTE — Progress Notes (Signed)
   Subjective:   Patient ID: Melissa Farmer, female    DOB: 06-Oct-1996, 24 y.o.   MRN: [id number]  HPI The patient is a 24 YO female coming in for concerns about left wrist and hand tendonitis. Symptoms started about 3 months ago and were intermittent at that time. Seen by PCP about 1 week ago and they asked for light duty accommodation which her work was unable to do and they just pulled her from work. She has been trying to rest at home but this is difficult as she has a small child. She was out of work yesterday and today. Woke up in the last day or two with numbness in the wrist and hand which lasted less than 5 minutes. She has been wearing the brace to sleep and during the day. Not taking any otc medications for pain. Using the topical TID and this is not helping much. Overall pain is more consistent with being home versus at work. Denies new injury or overuse to her knowledge.   Review of Systems  Constitutional: Negative.   HENT: Negative.   Eyes: Negative.   Respiratory: Negative for cough, chest tightness and shortness of breath.   Cardiovascular: Negative for chest pain, palpitations and leg swelling.  Gastrointestinal: Negative for abdominal distention, abdominal pain, constipation, diarrhea, nausea and vomiting.  Musculoskeletal: Positive for arthralgias and myalgias.  Skin: Negative.   Neurological: Positive for numbness.  Psychiatric/Behavioral: Negative.     Objective:  Physical Exam Constitutional:      Appearance: She is well-developed.  HENT:     Head: Normocephalic and atraumatic.  Cardiovascular:     Rate and Rhythm: Normal rate and regular rhythm.  Pulmonary:     Effort: Pulmonary effort is normal. No respiratory distress.     Breath sounds: Normal breath sounds. No wheezing or rales.  Abdominal:     General: Bowel sounds are normal. There is no distension.     Palpations: Abdomen is soft.     Tenderness: There is no abdominal tenderness. There is no  rebound.  Musculoskeletal:        General: Tenderness present.     Cervical back: Normal range of motion.     Comments: Wearing left wrist carpal tunnel brace  Skin:    General: Skin is warm and dry.  Neurological:     Mental Status: She is alert and oriented to person, place, and time.     Coordination: Coordination normal.     Vitals:   05/29/20 1000  BP: 116/78  Pulse: (!) 107  Temp: 98.2 F (36.8 C)  TempSrc: Oral  SpO2: 98%  Weight: 107 lb (48.5 kg)  Height: 5\' 6"  (1.676 m)    This visit occurred during the SARS-CoV-2 public health emergency.  Safety protocols were in place, including screening questions prior to the visit, additional usage of staff PPE, and extensive cleaning of exam room while observing appropriate contact time as indicated for disinfecting solutions.   Assessment & Plan:  Depo-medrol 40 mg IM given at visit

## 2020-05-30 ENCOUNTER — Encounter: Payer: Self-pay | Admitting: Nurse Practitioner

## 2020-06-11 ENCOUNTER — Other Ambulatory Visit: Payer: Self-pay

## 2020-06-11 ENCOUNTER — Other Ambulatory Visit (HOSPITAL_COMMUNITY)
Admission: RE | Admit: 2020-06-11 | Discharge: 2020-06-11 | Disposition: A | Discharge status: Home / Self Care | Payer: Medicaid Other | Source: Ambulatory Visit | Attending: Nurse Practitioner | Admitting: Nurse Practitioner

## 2020-06-11 ENCOUNTER — Encounter: Payer: Self-pay | Admitting: Nurse Practitioner

## 2020-06-11 ENCOUNTER — Ambulatory Visit (INDEPENDENT_AMBULATORY_CARE_PROVIDER_SITE_OTHER): Payer: Medicaid Other | Admitting: Nurse Practitioner

## 2020-06-11 VITALS — BP 110/68 | HR 99 | Temp 97.9°F | Ht 66.0 in | Wt 109.6 lb

## 2020-06-11 DIAGNOSIS — B9689 Other specified bacterial agents as the cause of diseases classified elsewhere: Secondary | ICD-10-CM

## 2020-06-11 DIAGNOSIS — A749 Chlamydial infection, unspecified: Secondary | ICD-10-CM

## 2020-06-11 DIAGNOSIS — Z114 Encounter for screening for human immunodeficiency virus [HIV]: Secondary | ICD-10-CM

## 2020-06-11 DIAGNOSIS — N760 Acute vaginitis: Secondary | ICD-10-CM | POA: Diagnosis not present

## 2020-06-11 DIAGNOSIS — N898 Other specified noninflammatory disorders of vagina: Secondary | ICD-10-CM | POA: Insufficient documentation

## 2020-06-11 MED ORDER — METRONIDAZOLE 0.75 % VA GEL: 1.0000 | Freq: Every day | VAGINAL | 0 refills | Status: DC

## 2020-06-11 NOTE — Patient Instructions (Signed)

## 2020-06-11 NOTE — Progress Notes (Addendum)
Subjective:  Patient ID: Lorane Gell, female    DOB: February 01, 1996  Age: 24 y.o. MRN: [id number]  CC: Exposure to STD (C/O vaginal discharge light yellow creamy very icthy. Condom was left in vagina for 1 day patient removed but not sure if this is the cause for vaginal irritation. Would like to be checked for STD)  Exposure to STD  The patient's primary symptoms include a discharge and genital itching. The patient's pertinent negatives include no dyspareunia, dysuria, genital lesions, genital rash or pelvic pain. This is a new problem. The current episode started in the past 7 days. The problem has been unchanged. The vaginal discharge was thin and brown. Associate symptoms include a genital odor. Pertinent negatives include no abdominal pain, anorexia, diaphoresis, fever, rectal pain, sore throat or urinary frequency. She has tried nothing for the symptoms.  compliant with OCP  Reviewed past Medical, Social and Family history today.  Outpatient Medications Prior to Visit  Medication Sig Dispense Refill  . busPIRone (BUSPAR) 7.5 MG tablet Take 1 tablet (7.5 mg total) by mouth 2 (two) times daily. 60 tablet 5  . diclofenac Sodium (VOLTAREN) 1 % GEL Apply 4 g topically 3 (three) times daily. 100 g 0  . norgestimate-ethinyl estradiol (ORTHO-CYCLEN) 0.25-35 MG-MCG tablet Take 1 tablet by mouth daily. 1 Package 11  . meloxicam (MOBIC) 15 MG tablet Take 1 tablet (15 mg total) by mouth daily. (Patient not taking: Reported on 06/11/2020) 30 tablet 0   No facility-administered medications prior to visit.    ROS See HPI  Objective:  BP 110/68   Pulse 99   Temp 97.9 F (36.6 C) (Tympanic)   Ht 5\' 6"  (1.676 m)   Wt 109 lb 9.6 oz (49.7 kg)   SpO2 98%   BMI 17.69 kg/m   Physical Exam Exam conducted with a chaperone present.  Genitourinary:    Labia:        Right: No rash, tenderness or lesion.        Left: No rash, tenderness or lesion.      Vagina: Vaginal discharge and erythema  present.     Cervix: Discharge and erythema present. No friability.     Adnexa: Right adnexa normal and left adnexa normal.  Lymphadenopathy:     Lower Body: No right inguinal adenopathy. No left inguinal adenopathy.  Neurological:     Mental Status: She is alert.    Assessment & Plan:  This visit occurred during the SARS-CoV-2 public health emergency.  Safety protocols were in place, including screening questions prior to the visit, additional usage of staff PPE, and extensive cleaning of exam room while observing appropriate contact time as indicated for disinfecting solutions.   Louetta was seen today for exposure to std.  Diagnoses and all orders for this visit:  BV (bacterial vaginosis) -     Cervicovaginal ancillary only( [place]) -     metroNIDAZOLE (METROGEL VAGINAL) 0.75 % vaginal gel; Place 1 Applicatorful vaginally at bedtime.  Chlamydia infection -     azithromycin (ZITHROMAX) 500 MG tablet; Take 2 tablets (1,000 mg total) by mouth once for 1 dose. -     Urine cytology ancillary only([place]); Future  Encounter for screening for human immunodeficiency virus (HIV) -     HIV antibody (with reflex); Future    Problem List Items Addressed This Visit    None    Visit Diagnoses    BV (bacterial vaginosis)    -  Primary  Relevant Medications   metroNIDAZOLE (METROGEL VAGINAL) 0.75 % vaginal gel   azithromycin (ZITHROMAX) 500 MG tablet   Other Relevant Orders   Cervicovaginal ancillary only( [place]) (Completed)   Chlamydia infection       Relevant Medications   azithromycin (ZITHROMAX) 500 MG tablet   Other Relevant Orders   Urine cytology ancillary only([place])   Encounter for screening for human immunodeficiency virus (HIV)       Relevant Orders   HIV antibody (with reflex)      Follow-up: No follow-ups on file.  Alysia Penna, NP

## 2020-06-13 LAB — CERVICOVAGINAL ANCILLARY ONLY
Bacterial Vaginitis (gardnerella): POSITIVE — AB
Candida Glabrata: NEGATIVE
Candida Vaginitis: NEGATIVE
Chlamydia: POSITIVE — AB
Comment: NEGATIVE
Comment: NEGATIVE
Comment: NEGATIVE
Comment: NEGATIVE
Comment: NEGATIVE
Comment: NORMAL
Neisseria Gonorrhea: NEGATIVE
Trichomonas: NEGATIVE

## 2020-06-14 MED ORDER — AZITHROMYCIN 500 MG PO TABS: 1000.0000 mg | ORAL_TABLET | Freq: Once | ORAL | 0 refills | Status: AC

## 2020-06-14 NOTE — Addendum Note (Signed)
Addended by: Alysia Penna L on: 06/14/2020 08:04 AM   Modules accepted: Orders

## 2020-06-18 ENCOUNTER — Other Ambulatory Visit: Payer: Self-pay

## 2020-06-18 ENCOUNTER — Ambulatory Visit (INDEPENDENT_AMBULATORY_CARE_PROVIDER_SITE_OTHER): Payer: Medicaid Other

## 2020-06-18 ENCOUNTER — Ambulatory Visit (HOSPITAL_COMMUNITY)
Admission: EM | Admit: 2020-06-18 | Discharge: 2020-06-18 | Disposition: A | Discharge status: Home / Self Care | Payer: Medicaid Other | Attending: Family Medicine | Admitting: Family Medicine

## 2020-06-18 ENCOUNTER — Encounter (HOSPITAL_COMMUNITY): Payer: Self-pay

## 2020-06-18 DIAGNOSIS — M25532 Pain in left wrist: Secondary | ICD-10-CM

## 2020-06-18 DIAGNOSIS — S52615A Nondisplaced fracture of left ulna styloid process, initial encounter for closed fracture: Secondary | ICD-10-CM

## 2020-06-18 NOTE — ED Provider Notes (Signed)
[facility]    CSN: [id number] Arrival date & time: 06/18/20  1344      History   Chief Complaint Chief Complaint  Patient presents with  . Wrist Pain    HPI Melissa Farmer is a 24 y.o. female.   HPI   Patient is here for left wrist pain.  She states she is having left wrist pain for many months.  She states that originally it came on after a shift where she did a lot of repetitive movement and lifting.  She went to her private doctor.  She was told she had tendinitis.  She was given anti-inflammatories and a brace.  She states it got better, and then came back again.  Her doctor gave her a steroid shot (IM).  This also improved for a while.  She now has wrist pain that is come back.  Is worse.  Yesterday while at work she moved her wrist felt a pop and increased pain.  She points to her ulnar styloid. Normal use of hand.  No numbness or weakness. Prior history of carpal tunnel syndrome.  No numbness now.   Past Medical History:  Diagnosis Date  . Medical history non-contributory     Patient Active Problem List   Diagnosis Date Noted  . Anxiety 05/23/2020  . Left wrist tendonitis 05/22/2020  . Intermittent palpitations 05/22/2020  . Otitis media 09/09/2019  . Headache 03/26/2016    Past Surgical History:  Procedure Laterality Date  . NO PAST SURGERIES      OB History    Gravida  1   Para  1   Term  1   Preterm      AB      Living  1     SAB      TAB      Ectopic      Multiple  0   Live Births  1            Home Medications    Prior to Admission medications   Medication Sig Start Date End Date Taking? Authorizing Provider  busPIRone (BUSPAR) 7.5 MG tablet Take 1 tablet (7.5 mg total) by mouth 2 (two) times daily. 05/23/20   Nche, Bonna Gains, NP  diclofenac Sodium (VOLTAREN) 1 % GEL Apply 4 g topically 3 (three) times daily. 05/22/20   Nche, Bonna Gains, NP  metroNIDAZOLE (METROGEL VAGINAL) 0.75 % vaginal gel Place 1  Applicatorful vaginally at bedtime. 06/11/20   Nche, Bonna Gains, NP  norgestimate-ethinyl estradiol (ORTHO-CYCLEN) 0.25-35 MG-MCG tablet Take 1 tablet by mouth daily. 12/26/19   Currie Paris, NP    Family History Family History  Problem Relation Age of Onset  . Kidney disease Maternal Grandmother   . Stroke Maternal Grandfather   . Diabetes Maternal Grandfather   . Heart disease Maternal Grandfather   . Cancer Paternal Grandmother   . Asthma Paternal Grandmother   . Asthma Father     Social History Social History   Tobacco Use  . Smoking status: Never Smoker  . Smokeless tobacco: Never Used  Vaping Use  . Vaping Use: Never used  Substance Use Topics  . Alcohol use: No    Alcohol/week: 0.0 standard drinks  . Drug use: No     Allergies   Patient has no known allergies.   Review of Systems Review of Systems See HPI  Physical Exam Triage Vital Signs ED Triage Vitals  Enc Vitals Group     BP  06/18/20 1445 (!) 130/57     Pulse Rate 06/18/20 1445 73     Resp 06/18/20 1445 18     Temp 06/18/20 1445 98.2 F (36.8 C)     Temp Source 06/18/20 1445 Oral     SpO2 06/18/20 1445 100 %     Weight --      Height --      Head Circumference --      Peak Flow --      Pain Score 06/18/20 1444 9     Pain Loc --      Pain Edu? --      Excl. in GC? --    No data found.  Updated Vital Signs BP (!) 130/57 (BP Location: Right Arm)   Pulse 73   Temp 98.2 F (36.8 C) (Oral)   Resp 18   SpO2 100%       Physical Exam Constitutional:      General: She is not in acute distress.    Appearance: She is well-developed.  HENT:     Head: Normocephalic and atraumatic.  Eyes:     Conjunctiva/sclera: Conjunctivae normal.     Pupils: Pupils are equal, round, and reactive to light.  Cardiovascular:     Rate and Rhythm: Normal rate.  Pulmonary:     Effort: Pulmonary effort is normal. No respiratory distress.  Abdominal:     General: There is no distension.      Palpations: Abdomen is soft.  Musculoskeletal:        General: Normal range of motion.     Cervical back: Normal range of motion.     Comments: Tenderness over ulnar wrist.  Very limited range of motion.  Finger movement good.  Skin:    General: Skin is warm and dry.  Neurological:     Mental Status: She is alert.  Psychiatric:        Mood and Affect: Mood normal.        Behavior: Behavior normal.      UC Treatments / Results  Labs (all labs ordered are listed, but only abnormal results are displayed) Labs Reviewed - No data to display  EKG   Radiology DG Wrist Complete Left  Result Date: 06/18/2020 CLINICAL DATA:  Chronic left wrist pain. EXAM: LEFT WRIST - COMPLETE 3+ VIEW COMPARISON:  None. FINDINGS: Nonunited ulnar styloid fracture. Distal radius and carpal bones appear normal. IMPRESSION: Nonunited ulnar styloid fracture. Electronically Signed   By: Paulina Fusi M.D.   On: 06/18/2020 15:39    Procedures Procedures (including critical care time)  Medications Ordered in UC Medications - No data to display  Initial Impression / Assessment and Plan / UC Course  I have reviewed the triage vital signs and the nursing notes.  Pertinent labs & imaging results that were available during my care of the patient were reviewed by me and considered in my medical decision making (see chart for details).     Ulnar a styloid fracture.  Referred for orthopedic evaluation Final Clinical Impressions(s) / UC Diagnoses   Final diagnoses:  Left wrist pain  Closed nondisplaced fracture of styloid process of left ulna, initial encounter     Discharge Instructions     Wear brace, except to wash and apply lotion, at all times Call orthopedics for follow-up Limit use of left hand   ED Prescriptions    None     PDMP not reviewed this encounter.   [name], MD 06/18/20 [phone]

## 2020-06-18 NOTE — Discharge Instructions (Signed)
Wear brace, except to wash and apply lotion, at all times Call orthopedics for follow-up Limit use of left hand

## 2020-06-18 NOTE — ED Triage Notes (Signed)
Pt presents with left wrist pain since yesterday.  

## 2020-06-19 ENCOUNTER — Other Ambulatory Visit: Payer: Self-pay

## 2020-06-20 ENCOUNTER — Encounter: Payer: Self-pay | Admitting: Nurse Practitioner

## 2020-06-20 ENCOUNTER — Ambulatory Visit (INDEPENDENT_AMBULATORY_CARE_PROVIDER_SITE_OTHER): Payer: Medicaid Other | Admitting: Nurse Practitioner

## 2020-06-20 VITALS — BP 118/84 | HR 98 | Temp 97.4°F | Ht 66.0 in | Wt 110.2 lb

## 2020-06-20 DIAGNOSIS — S52615A Nondisplaced fracture of left ulna styloid process, initial encounter for closed fracture: Secondary | ICD-10-CM

## 2020-06-20 MED ORDER — TRAMADOL HCL 50 MG PO TABS: 50.0000 mg | ORAL_TABLET | Freq: Two times a day (BID) | ORAL | 0 refills | Status: AC | PRN

## 2020-06-20 MED ORDER — IBUPROFEN 600 MG PO TABS: 600.0000 mg | ORAL_TABLET | Freq: Three times a day (TID) | ORAL | 0 refills | Status: DC | PRN

## 2020-06-20 NOTE — Progress Notes (Signed)
° °  Subjective:  Patient ID: Melissa Farmer, female    DOB: 1996/08/14  Age: 24 y.o. MRN: [id number]  CC: Acute Visit (left hand pain//burning and sore and can't move it an it throbs)  HPI Melissa Farmer reports worsening left wrist pain after returning to work. She was evaluated by ED provider. X-ray revealed ulnar styloid fracture. She was severe wrist pain with supination and pronation. Has numbness in 4th and 5th finger. No swelling, no decreased ROM, no redness. Minimal pain relief with ibuprofen 400mg  once a day and wrist brace. She is unable to remember any specific injury that may have caused fracture. She denies any fall. She needs referral to ortho.  Reviewed past Medical, Social and Family history today.  Outpatient Medications Prior to Visit  Medication Sig Dispense Refill   busPIRone (BUSPAR) 7.5 MG tablet Take 1 tablet (7.5 mg total) by mouth 2 (two) times daily. 60 tablet 5   diclofenac Sodium (VOLTAREN) 1 % GEL Apply 4 g topically 3 (three) times daily. 100 g 0   norgestimate-ethinyl estradiol (ORTHO-CYCLEN) 0.25-35 MG-MCG tablet Take 1 tablet by mouth daily. 1 Package 11   metroNIDAZOLE (METROGEL VAGINAL) 0.75 % vaginal gel Place 1 Applicatorful vaginally at bedtime. (Patient not taking: Reported on 06/20/2020) 70 g 0   No facility-administered medications prior to visit.    ROS See HPI  Objective:  BP 118/84    Pulse 98    Temp (!) 97.4 F (36.3 C) (Tympanic)    Ht 5\' 6"  (1.676 m)    Wt 110 lb 3.2 oz (50 kg)    SpO2 99%    BMI 17.79 kg/m   Physical Exam Musculoskeletal:     Left elbow: Normal.     Left forearm: Normal.     Left wrist: Tenderness and bony tenderness present. No swelling, effusion, snuff box tenderness or crepitus. Decreased range of motion. Normal pulse.     Left hand: Normal.    Assessment & Plan:  This visit occurred during the SARS-CoV-2 public health emergency.  Safety protocols were in place, including screening questions prior to the  visit, additional usage of staff PPE, and extensive cleaning of exam room while observing appropriate contact time as indicated for disinfecting solutions.   Melissa Farmer was seen today for acute visit.  Diagnoses and all orders for this visit:  Closed nondisplaced fracture of styloid process of left ulna, initial encounter -     AMB referral to orthopedics -     ibuprofen (ADVIL) 600 MG tablet; Take 1 tablet (600 mg total) by mouth every 8 (eight) hours as needed (with food). -     traMADol (ULTRAM) 50 MG tablet; Take 1 tablet (50 mg total) by mouth every 12 (twelve) hours as needed for up to 3 days.    Problem List Items Addressed This Visit      Musculoskeletal and Integument   Closed nondisplaced fracture of styloid process of left ulna - Primary   Relevant Medications   ibuprofen (ADVIL) 600 MG tablet   traMADol (ULTRAM) 50 MG tablet   Other Relevant Orders   AMB referral to orthopedics      Follow-up: No follow-ups on file.  , NP

## 2020-06-20 NOTE — Patient Instructions (Signed)
Continue use of wrist brace. Use of ibuprofen and tramadol for pain You will be contacted to schedule appt with ortho

## 2020-06-22 ENCOUNTER — Other Ambulatory Visit: Payer: Self-pay

## 2020-06-22 ENCOUNTER — Ambulatory Visit: Payer: Medicaid Other | Admitting: Orthopaedic Surgery

## 2020-06-22 ENCOUNTER — Encounter: Payer: Self-pay | Admitting: Orthopaedic Surgery

## 2020-06-22 VITALS — Ht 66.0 in | Wt 110.0 lb

## 2020-06-22 DIAGNOSIS — S52615A Nondisplaced fracture of left ulna styloid process, initial encounter for closed fracture: Secondary | ICD-10-CM

## 2020-06-22 NOTE — Progress Notes (Signed)
Office Visit Note   Patient: Melissa Farmer           Date of Birth: 1996/07/02           MRN: [id number] Visit Date: 06/22/2020              Requested by: Anne Ng, NP [address] [place],  [place] [address] PCP: Anne Ng, NP   Assessment & Plan: Visit Diagnoses:  1. 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  This injury occurred of back in March and given the x-ray findings we will need to obtain a bone stimulator.  Work note was provided with restrictions.  She should continue to wear the wrist brace.  Follow-up in 6 weeks with three-view x-rays of the left wrist. Total face to face encounter time was greater than 45 minutes and over half of this time was spent in counseling and/or coordination of care.  Follow-Up Instructions: Return in about 6 weeks (around 08/03/2020).   Orders:  No orders of the defined types were placed in this encounter.  No orders of the defined types were placed in this encounter.     Procedures: No procedures performed   Clinical Data: No additional findings.   Subjective: Chief Complaint  Patient presents with  . Left Wrist - Pain    Melissa Farmer is a 24 year old female comes in for chronic left wrist pain.  She states that she was moving back in March when she felt something pop on the ulnar aspect of the left wrist.  She originally saw her PCP who told her that it was tendinitis and when she went back to work she had continued burning and pain.  She finally had an x-ray at the urgent care recently and it showed subacute ulnar styloid fracture.  She states she has occasional numbness in her small finger and twisting of the wrist causes discomfort.  She is currently wearing a removable wrist brace.  She takes a tramadol and ibuprofen occasionally with relief.   Review of Systems    Constitutional: Negative.   HENT: Negative.   Eyes: Negative.   Respiratory: Negative.   Cardiovascular: Negative.   Endocrine: Negative.   Musculoskeletal: Negative.   Neurological: Negative.   Hematological: Negative.   Psychiatric/Behavioral: Negative.   All other systems reviewed and are negative.    Objective: Vital Signs: Ht 5\' 6"  (1.676 m)   Wt 110 lb (49.9 kg)   BMI 17.75 kg/m   Physical Exam Vitals and nursing note reviewed.  Constitutional:      Appearance: She is well-developed.  HENT:     Head: Normocephalic and atraumatic.  Pulmonary:     Effort: Pulmonary effort is normal.  Abdominal:     Palpations: Abdomen is soft.  Musculoskeletal:     Cervical back: Neck supple.  Skin:    General: Skin is warm.     Capillary Refill: Capillary refill takes less than 2 seconds.  Neurological:     Mental Status: She is alert and oriented to person, place, and time.  Psychiatric:        Behavior: Behavior normal.        Thought Content: Thought content normal.        Judgment: Judgment normal.     Ortho Exam Left wrist shows no swelling or temperature changes.  She is neurovascular intact.  She is slightly tender over the ulnar styloid.  ECU tendon is stable without subluxation. Specialty Comments:  No specialty comments available.  Imaging: No results found.   PMFS History: Patient Active Problem List   Diagnosis Date Noted  . Closed nondisplaced fracture of styloid process of left ulna 06/20/2020  . Anxiety 05/23/2020  . Left wrist tendonitis 05/22/2020  . Intermittent palpitations 05/22/2020  . Otitis media 09/09/2019  . Headache 03/26/2016   Past Medical History:  Diagnosis Date  . Medical history non-contributory     Family History  Problem Relation Age of Onset  . Kidney disease Maternal Grandmother   . Stroke Maternal Grandfather   . Diabetes Maternal Grandfather   . Heart disease Maternal Grandfather   . Cancer Paternal Grandmother   .  Asthma Paternal Grandmother   . Asthma Father     Past Surgical History:  Procedure Laterality Date  . NO PAST SURGERIES     Social History   Occupational History  . Not on file  Tobacco Use  . Smoking status: Never Smoker  . Smokeless tobacco: Never Used  Vaping Use  . Vaping Use: Never used  Substance and Sexual Activity  . Alcohol use: No    Alcohol/week: 0.0 standard drinks  . Drug use: No  . Sexual activity: Yes    Birth control/protection: None

## 2020-06-25 ENCOUNTER — Telehealth: Payer: Self-pay

## 2020-06-25 ENCOUNTER — Other Ambulatory Visit: Payer: Self-pay

## 2020-06-25 NOTE — Telephone Encounter (Signed)
[employer] form received and sent to [employer] for completion

## 2020-06-26 ENCOUNTER — Ambulatory Visit (INDEPENDENT_AMBULATORY_CARE_PROVIDER_SITE_OTHER): Payer: Medicaid Other | Admitting: Nurse Practitioner

## 2020-06-26 ENCOUNTER — Encounter: Payer: Self-pay | Admitting: Nurse Practitioner

## 2020-06-26 VITALS — BP 112/64 | HR 84 | Temp 97.0°F | Ht 66.0 in | Wt 108.4 lb

## 2020-06-26 DIAGNOSIS — Z136 Encounter for screening for cardiovascular disorders: Secondary | ICD-10-CM

## 2020-06-26 DIAGNOSIS — Z0000 Encounter for general adult medical examination without abnormal findings: Secondary | ICD-10-CM | POA: Diagnosis not present

## 2020-06-26 DIAGNOSIS — E559 Vitamin D deficiency, unspecified: Secondary | ICD-10-CM

## 2020-06-26 DIAGNOSIS — D500 Iron deficiency anemia secondary to blood loss (chronic): Secondary | ICD-10-CM

## 2020-06-26 DIAGNOSIS — Z13220 Encounter for screening for lipoid disorders: Secondary | ICD-10-CM

## 2020-06-26 DIAGNOSIS — F419 Anxiety disorder, unspecified: Secondary | ICD-10-CM

## 2020-06-26 LAB — LIPID PANEL
Cholesterol: 145 mg/dL (ref 0–200)
HDL: 51.6 mg/dL (ref >39.00)
LDL Cholesterol: 83 mg/dL (ref 0–99)
NonHDL: 93.69
Total CHOL/HDL Ratio: 3
Triglycerides: 52 mg/dL (ref 0.0–149.0)
VLDL: 10.4 mg/dL (ref 0.0–40.0)

## 2020-06-26 LAB — HEPATIC FUNCTION PANEL
ALT: 8 U/L (ref 0–35)
AST: 15 U/L (ref 0–37)
Albumin: 3.9 g/dL (ref 3.5–5.2)
Alkaline Phosphatase: 36 U/L — ABNORMAL LOW (ref 39–117)
Bilirubin, Direct: 0.1 mg/dL (ref 0.0–0.3)
Total Bilirubin: 0.6 mg/dL (ref 0.2–1.2)
Total Protein: 7.4 g/dL (ref 6.0–8.3)

## 2020-06-26 NOTE — Assessment & Plan Note (Addendum)
Current use of OCP, vaginal bleed every 30days x 7days, no clots, dysmenorrhea. No OTC iron supplement.  Check iron panel today

## 2020-06-26 NOTE — Progress Notes (Signed)
Subjective:    Patient ID: Melissa Farmer, female    DOB: Dec 23, 1995, 24 y.o.   MRN: [id number]  Patient presents today for CPE   HPI Anxiety Stable mood with buspar Continue current medication  Iron deficiency anemia due to chronic blood loss Current use of OCP, vaginal bleed every 30days x 7days, no clots, dysmenorrhea. No OTC iron supplement.  Check iron panel today   Sexual History (orientation,birth control, marital status, STD):up to date with PAP and STD screen, use of OCP for contraception  Depression/Suicide: Depression screen [facility] 2/9 06/11/2020 05/22/2020 05/22/2020 09/09/2019 09/21/2018 09/14/2018 09/07/2018  Decreased Interest 0 0 0 0 0 0 0  Down, Depressed, Hopeless 0 2 0 0 0 0 0  PHQ - 2 Score 0 2 0 0 0 0 0  Altered sleeping - 0 - - 3 2 2   Tired, decreased energy - 3 - - 0 0 1  Change in appetite - 2 - - 0 0 0  Feeling bad or failure about yourself  - 0 - - 0 0 0  Trouble concentrating - 0 - - 0 0 0  Moving slowly or fidgety/restless - 0 - - 0 0 0  Suicidal thoughts - 0 - - 0 0 0  PHQ-9 Score - 7 - - 3 2 3    Vision:not needed  Dental:up to date  Immunizations: (TDAP, Hep C screen, Pneumovax, Influenza, zoster)  Health Maintenance  Topic Date Due  . Flu Shot  06/17/2020  . Chlamydia screening  06/11/2021  . Pap Smear  12/25/2022  . Pap Smear  12/25/2022  . Tetanus Vaccine  06/28/2028  .  Hepatitis C: One time screening is recommended by Center for Disease Control  (CDC) for  adults born from [birth year] through 1963.   Completed  . HIV Screening  Completed   Diet:regular  Weight:  Wt Readings from Last 3 Encounters:  06/26/20 108 lb 6.4 oz (49.2 kg)  06/22/20 110 lb (49.9 kg)  06/20/20 110 lb 3.2 oz (50 kg)    Fall Risk: Fall Risk  05/22/2020 09/09/2019  Falls in the past year? 0 0  Number falls in past yr: 0 -  Injury with Fall? 0 -   Medications and allergies reviewed with patient and updated if appropriate.  Patient Active Problem List    Diagnosis Date Noted  . Iron deficiency anemia due to chronic blood loss 06/26/2020  . Vitamin D insufficiency 06/26/2020  . Closed nondisplaced fracture of styloid process of left ulna 06/20/2020  . Anxiety 05/23/2020  . Left wrist tendonitis 05/22/2020  . Otitis media 09/09/2019  . Headache 03/26/2016    Current Outpatient Medications on File Prior to Visit  Medication Sig Dispense Refill  . busPIRone (BUSPAR) 7.5 MG tablet Take 1 tablet (7.5 mg total) by mouth 2 (two) times daily. 60 tablet 5  . ibuprofen (ADVIL) 600 MG tablet Take 1 tablet (600 mg total) by mouth every 8 (eight) hours as needed (with food). 30 tablet 0  . norgestimate-ethinyl estradiol (ORTHO-CYCLEN) 0.25-35 MG-MCG tablet Take 1 tablet by mouth daily. 1 Package 11   No current facility-administered medications on file prior to visit.    Past Medical History:  Diagnosis Date  . Medical history non-contributory     Past Surgical History:  Procedure Laterality Date  . NO PAST SURGERIES      Social History   Socioeconomic History  . Marital status: Single    Spouse name: Not on file  . Number  of children: Not on file  . Years of education: Not on file  . Highest education level: Not on file  Occupational History  . Not on file  Tobacco Use  . Smoking status: Never Smoker  . Smokeless tobacco: Never Used  Vaping Use  . Vaping Use: Never used  Substance and Sexual Activity  . Alcohol use: No    Alcohol/week: 0.0 standard drinks  . Drug use: No  . Sexual activity: Yes    Birth control/protection: None  Other Topics Concern  . 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   Social Determinants of Health   Financial Resource Strain:   . Difficulty of Paying Living Expenses:   Food Insecurity:   . Worried About Programme researcher, broadcasting/film/video in the Last Year:   . Barista in the Last Year:   Transportation Needs:   . Freight forwarder  (Medical):   Marland Kitchen Lack of Transportation (Non-Medical):   Physical Activity:   . Days of Exercise per Week:   . Minutes of Exercise per Session:   Stress:   . Feeling of Stress :   Social Connections:   . Frequency of Communication with Friends and Family:   . Frequency of Social Gatherings with Friends and Family:   . Attends Religious Services:   . Active Member of Clubs or Organizations:   . Attends Banker Meetings:   Marland Kitchen Marital Status:     Family History  Problem Relation Age of Onset  . Kidney disease Maternal Grandmother   . Stroke Maternal Grandfather   . Diabetes Maternal Grandfather   . Heart disease Maternal Grandfather   . Cancer Paternal Grandmother   . Asthma Paternal Grandmother   . Asthma Father         Review of Systems  Constitutional: Negative for fever, malaise/fatigue and weight loss.  HENT: Negative for congestion and sore throat.   Eyes:       Negative for visual changes  Respiratory: Negative for cough and shortness of breath.   Cardiovascular: Negative for chest pain, palpitations and leg swelling.  Gastrointestinal: Negative for blood in stool, constipation, diarrhea and heartburn.  Genitourinary: Negative for dysuria, frequency and urgency.  Musculoskeletal: Negative for falls, joint pain and myalgias.  Skin: Negative for rash.  Neurological: Negative for dizziness, sensory change and headaches.  Endo/Heme/Allergies: Does not bruise/bleed easily.  Psychiatric/Behavioral: Negative for depression, substance abuse and suicidal ideas. The patient is not nervous/anxious.     Objective:   Vitals:   06/26/20 0915  BP: 112/64  Pulse: 84  Temp: (!) 97 F (36.1 C)  SpO2: 100%    Body mass index is 17.5 kg/m.   Physical Examination:  Physical Exam Vitals reviewed.  Constitutional:      General: She is not in acute distress.    Appearance: She is well-developed.  HENT:     Right Ear: Tympanic membrane, ear canal and external  ear normal.     Left Ear: Tympanic membrane, ear canal and external ear normal.  Eyes:     Extraocular Movements: Extraocular movements intact.     Conjunctiva/sclera: Conjunctivae normal.  Cardiovascular:     Rate and Rhythm: Normal rate and regular rhythm.     Pulses: Normal pulses.     Heart sounds: Normal heart sounds.  Pulmonary:     Effort: Pulmonary effort is normal. No respiratory distress.  Breath sounds: Normal breath sounds.  Chest:     Chest wall: No tenderness.     Breasts:        Right: Normal.        Left: Normal.  Abdominal:     General: Bowel sounds are normal.     Palpations: Abdomen is soft.  Musculoskeletal:        General: Normal range of motion.     Cervical back: Normal range of motion and neck supple.  Lymphadenopathy:     Cervical: No cervical adenopathy.     Upper Body:     Right upper body: No supraclavicular, axillary or pectoral adenopathy.     Left upper body: No supraclavicular, axillary or pectoral adenopathy.  Skin:    General: Skin is warm and dry.  Neurological:     Mental Status: She is alert and oriented to person, place, and time.     Deep Tendon Reflexes: Reflexes are normal and symmetric.  Psychiatric:        Mood and Affect: Mood normal.        Behavior: Behavior normal.        Thought Content: Thought content normal.    ASSESSMENT and PLAN: This visit occurred during the SARS-CoV-2 public health emergency.  Safety protocols were in place, including screening questions prior to the visit, additional usage of staff PPE, and extensive cleaning of exam room while observing appropriate contact time as indicated for disinfecting solutions.   Melissa Farmer was seen today for annual exam.  Diagnoses and all orders for this visit:  Preventative health care -     Lipid panel -     Hepatic function panel  Iron deficiency anemia due to chronic blood loss -     Iron, TIBC and Ferritin Panel  Vitamin D insufficiency -     Vitamin D  1,25 dihydroxy  Encounter for lipid screening for cardiovascular disease -     Lipid panel  Anxiety        Problem List Items Addressed This Visit      Other   Anxiety    Stable mood with buspar Continue current medication      Iron deficiency anemia due to chronic blood loss    Current use of OCP, vaginal bleed every 30days x 7days, no clots, dysmenorrhea. No OTC iron supplement.  Check iron panel today      Relevant Orders   Iron, TIBC and Ferritin Panel   Vitamin D insufficiency   Relevant Orders   Vitamin D 1,25 dihydroxy    Other Visit Diagnoses    Preventative health care    -  Primary   Relevant Orders   Lipid panel   Hepatic function panel   Encounter for lipid screening for cardiovascular disease       Relevant Orders   Lipid panel      Follow up: Return in about 1 year (around 06/26/2021) for CPE.  Alysia Penna, NP

## 2020-06-26 NOTE — Assessment & Plan Note (Signed)
Stable mood with buspar Continue current medication

## 2020-06-26 NOTE — Patient Instructions (Signed)
Start calcium 616m BID and vitamin D 1000IU daily  Go to lab for blood draw  Preventive Care 24Years Old, Female Preventive care refers to lifestyle choices and visits with your health care provider that can promote health and wellness. At this stage in your life, you may start seeing a primary care physician instead of a pediatrician. Your health care is now your responsibility. Preventive care for young adults includes:  A yearly physical exam. This is also called an annual wellness visit.  Regular dental and eye exams.  Immunizations.  Screening for certain conditions.  Healthy lifestyle choices, such as diet and exercise. What can I expect for my preventive care visit? Physical exam Your health care provider may check:  Height and weight. These may be used to calculate body mass index (BMI), which is a measurement that tells if you are at a healthy weight.  Heart rate and blood pressure.  Body temperature. Counseling Your health care provider may ask you questions about:  Past medical problems and family medical history.  Alcohol, tobacco, and drug use.  Home and relationship well-being.  Access to firearms.  Emotional well-being.  Diet, exercise, and sleep habits.  Sexual activity and sexual health.  Method of birth control.  Menstrual cycle.  Pregnancy history. What immunizations do I need?  Influenza (flu) vaccine  This is recommended every year. Tetanus, diphtheria, and pertussis (Tdap) vaccine  You may need a Td booster every 10 years. Varicella (chickenpox) vaccine  You may need this vaccine if you have not already been vaccinated. Human papillomavirus (HPV) vaccine  If recommended by your health care provider, you may need three doses over 6 months. Measles, mumps, and rubella (MMR) vaccine  You may need at least one dose of MMR. You may also need a second dose. Meningococcal conjugate (MenACWY) vaccine  One dose is recommended if you  are [age]years old and a fMarket researcherliving in a residence hall, or if you have one of several medical conditions. You may also need additional booster doses. Pneumococcal conjugate (PCV13) vaccine  You may need this if you have certain conditions and were not previously vaccinated. Pneumococcal polysaccharide (PPSV23) vaccine  You may need one or two doses if you smoke cigarettes or if you have certain conditions. Hepatitis A vaccine  You may need this if you have certain conditions or if you travel or work in places where you may be exposed to hepatitis A. Hepatitis B vaccine  You may need this if you have certain conditions or if you travel or work in places where you may be exposed to hepatitis B. Haemophilus influenzae type b (Hib) vaccine  You may need this if you have certain risk factors. You may receive vaccines as individual doses or as more than one vaccine together in one shot (combination vaccines). Talk with your health care provider about the risks and benefits of combination vaccines. What tests do I need? Blood tests  Lipid and cholesterol levels. These may be checked every 5 years starting at age 22  Hepatitis C test.  Hepatitis B test. Screening  Pelvic exam and Pap test. This may be done every 3 years starting at age 22  Sexually transmitted disease (STD) testing, if you are at risk.  BRCA-related cancer screening. This may be done if you have a family history of breast, ovarian, tubal, or peritoneal cancers. Other tests  Tuberculosis skin test.  Vision and hearing tests.  Skin exam.  Breast exam. Follow  these instructions at home: Eating and drinking   Eat a diet that includes fresh fruits and vegetables, whole grains, lean protein, and low-fat dairy products.  Drink enough fluid to keep your urine pale yellow.  Do not drink alcohol if: ? Your health care provider tells you not to drink. ? You are pregnant, may be pregnant, or  are planning to become pregnant. ? You are under the legal drinking age. In the [place] the legal drinking age is 39.  If you drink alcohol: ? Limit how much you have to 0-1 drink a day. ? Be aware of how much alcohol is in your drink. In the [place] one drink equals one 12 oz bottle of beer (355 mL), one 5 oz glass of wine (148 mL), or one 1 oz glass of hard liquor (44 mL). Lifestyle  Take daily care of your teeth and gums.  Stay active. Exercise at least 30 minutes 5 or more days of the week.  Do not use any products that contain nicotine or tobacco, such as cigarettes, e-cigarettes, and chewing tobacco. If you need help quitting, ask your health care provider.  Do not use drugs.  If you are sexually active, practice safe sex. Use a condom or other form of birth control (contraception) in order to prevent pregnancy and STIs (sexually transmitted infections). If you plan to become pregnant, see your health care provider for a pre-conception visit.  Find healthy ways to cope with stress, such as: ? Meditation, yoga, or listening to music. ? Journaling. ? Talking to a trusted person. ? Spending time with friends and family. Safety  Always wear your seat belt while driving or riding in a vehicle.  Do not drive if you have been drinking alcohol. Do not ride with someone who has been drinking.  Do not drive when you are tired or distracted. Do not text while driving.  Wear a helmet and other protective equipment during sports activities.  If you have firearms in your house, make sure you follow all gun safety procedures.  Seek help if you have been bullied, physically abused, or sexually abused.  Use the Internet responsibly to avoid dangers such as online bullying and online sex predators. What's next?  Go to your health care provider once a year for a well check visit.  Ask your health care provider how often you should have your eyes and teeth checked.  Stay up to date on all  vaccines. This information is not intended to replace advice given to you by your health care provider. Make sure you discuss any questions you have with your health care provider. Document Revised: 10/28/2018 Document Reviewed: 10/28/2018 Elsevier Patient Education  2020 [employer].

## 2020-06-28 LAB — VITAMIN D 1,25 DIHYDROXY
Vitamin D 1, 25 (OH)2 Total: 50 pg/mL (ref 18–72)
Vitamin D2 1, 25 (OH)2: <8 pg/mL
Vitamin D3 1, 25 (OH)2: 50 pg/mL

## 2020-06-28 LAB — IRON,TIBC AND FERRITIN PANEL
%SAT: 27 % (calc) (ref 16–45)
Ferritin: 15 ng/mL — ABNORMAL LOW (ref 16–154)
Iron: 111 ug/dL (ref 40–190)
TIBC: 411 mcg/dL (calc) (ref 250–450)

## 2020-07-04 ENCOUNTER — Telehealth: Payer: Self-pay

## 2020-07-04 NOTE — Telephone Encounter (Signed)
Pt's FMLA papers were faxed over to [facility] [phone] for them to fill out and follow with.

## 2020-07-06 ENCOUNTER — Other Ambulatory Visit: Payer: Self-pay

## 2020-07-06 ENCOUNTER — Other Ambulatory Visit: Payer: Medicaid Other

## 2020-07-06 DIAGNOSIS — Z20822 Contact with and (suspected) exposure to covid-19: Secondary | ICD-10-CM

## 2020-07-07 LAB — NOVEL CORONAVIRUS, NAA: SARS-CoV-2, NAA: DETECTED — AB

## 2020-07-07 LAB — SARS-COV-2, NAA 2 DAY TAT

## 2020-07-07 LAB — SPECIMEN STATUS REPORT

## 2020-07-20 ENCOUNTER — Telehealth: Payer: Self-pay | Admitting: Orthopaedic Surgery

## 2020-07-20 NOTE — Telephone Encounter (Signed)
Pt called stating she's been trying to get a CB from Dr.Xu but hasn't heard anything back. Pt would like a CB please   [phone]

## 2020-07-20 NOTE — Telephone Encounter (Signed)
Please advise on message below. Thank you!

## 2020-07-24 ENCOUNTER — Telehealth: Payer: Self-pay | Admitting: Orthopaedic Surgery

## 2020-07-24 NOTE — Telephone Encounter (Signed)
Pt called stating Dr.Xu hasn't filled out her return to work papers and she would really like to go back. Pt asks for a CB so they can discuss the quickest way to get this done.  [phone]

## 2020-07-24 NOTE — Telephone Encounter (Signed)
Left voice mail

## 2020-07-24 NOTE — Telephone Encounter (Signed)
Ic,lmvm advised forms received and sent to [employer] 8/9. [employer] mailed her packet on 8/10 advised need to sign auth and pay $25.00 form fee. Also, left her phone # for [employer]

## 2020-07-24 NOTE — Telephone Encounter (Signed)
Noted  

## 2020-07-24 NOTE — Telephone Encounter (Signed)
Have you seen anything on this patient from [employer]?

## 2020-08-03 ENCOUNTER — Ambulatory Visit: Payer: Medicaid Other | Admitting: Orthopaedic Surgery

## 2020-08-10 ENCOUNTER — Ambulatory Visit: Payer: Self-pay

## 2020-08-10 ENCOUNTER — Ambulatory Visit (INDEPENDENT_AMBULATORY_CARE_PROVIDER_SITE_OTHER): Payer: Medicaid Other | Admitting: Orthopaedic Surgery

## 2020-08-10 DIAGNOSIS — S52615D Nondisplaced fracture of left ulna styloid process, subsequent encounter for closed fracture with routine healing: Secondary | ICD-10-CM

## 2020-08-10 NOTE — Progress Notes (Signed)
° °  Office Visit Note   Patient: Melissa Farmer           Date of Birth: [birth year]           MRN: [id number] Visit Date: 08/10/2020              Requested by: Melissa Ng, NP [address] [place],  [place] [address] PCP: Melissa Ng, NP   Assessment & Plan: Visit Diagnoses:  1. Closed nondisplaced fracture of styloid process of left ulna with routine healing, subsequent encounter     Plan: Impression is healing left ulnar styloid fracture.  We will send a referral to hand and wrist rehab.  Recheck in 6 weeks with three-view x-rays of the left wrist.  Follow-Up Instructions: Return in about 6 weeks (around 09/21/2020).   Orders:  Orders Placed This Encounter  Procedures   XR Wrist Complete Left   Ambulatory referral to Occupational Therapy   No orders of the defined types were placed in this encounter.     Procedures: No procedures performed   Clinical Data: No additional findings.   Subjective: Chief Complaint  Patient presents with   Left Wrist - Follow-up    Melissa Farmer returns today for ulnar styloid fracture.  She is doing much better overall.  The symptoms are getting better.  She has been out of work for quite some time.  She did get fitted for the bone stimulator.   Review of Systems   Objective: Vital Signs: There were no vitals taken for this visit.  Physical Exam  Ortho Exam Left wrist shows minimal tenderness of the ulnar styloid.  Range of motion has improved with less guarding. Specialty Comments:  No specialty comments available.  Imaging: XR Wrist Complete Left  Result Date: 08/10/2020 Ulnar styloid fracture appears to be healing and consolidating.    PMFS History: Patient Active Problem List   Diagnosis Date Noted   Iron deficiency anemia due to chronic blood loss 06/26/2020   Vitamin D insufficiency 06/26/2020   Closed nondisplaced fracture of styloid process of left ulna 06/20/2020   Anxiety  05/23/2020   Left wrist tendonitis 05/22/2020   Otitis media 09/09/2019   Headache 03/26/2016   Past Medical History:  Diagnosis Date   Medical history non-contributory     Family History  Problem Relation Age of Onset   Kidney disease Maternal Grandmother    Stroke Maternal Grandfather    Diabetes Maternal Grandfather    Heart disease Maternal Grandfather    Cancer Paternal Grandmother    Asthma Paternal Grandmother    Asthma Father     Past Surgical History:  Procedure Laterality Date   NO PAST SURGERIES     Social History   Occupational History   Not on file  Tobacco Use   Smoking status: Never Smoker   Smokeless tobacco: Never Used  Vaping Use   Vaping Use: Never used  Substance and Sexual Activity   Alcohol use: No    Alcohol/week: 0.0 standard drinks   Drug use: No   Sexual activity: Yes    Birth control/protection: None

## 2020-08-22 ENCOUNTER — Ambulatory Visit: Payer: Medicaid Other | Attending: Orthopaedic Surgery | Admitting: Occupational Therapy

## 2020-08-22 DIAGNOSIS — M6281 Muscle weakness (generalized): Secondary | ICD-10-CM | POA: Insufficient documentation

## 2020-08-22 DIAGNOSIS — M25532 Pain in left wrist: Secondary | ICD-10-CM | POA: Insufficient documentation

## 2020-09-10 ENCOUNTER — Ambulatory Visit: Payer: Medicaid Other | Admitting: Occupational Therapy

## 2020-09-10 ENCOUNTER — Other Ambulatory Visit: Payer: Self-pay

## 2020-09-10 DIAGNOSIS — M6281 Muscle weakness (generalized): Secondary | ICD-10-CM

## 2020-09-10 DIAGNOSIS — M25532 Pain in left wrist: Secondary | ICD-10-CM | POA: Diagnosis not present

## 2020-09-10 NOTE — Therapy (Signed)
[facility] [address] [place], [place], [address] Phone: [phone]   Fax:  [phone]  Occupational Therapy Treatment  Patient Details  Name: Melissa Farmer MRN: [id number] Date of Birth: 1996-08-29 Referring Provider (OT): Dr. Gershon Mussel   Encounter Date: 09/10/2020   OT End of Session - 09/10/20 1427    Visit Number 1    Number of Visits 6    Authorization Type [employer] MCD - No auth required over 68 y.o. (27 visit limit per year)    OT Start Time 0930    OT Stop Time 1015    OT Time Calculation (min) 45 min    Activity Tolerance Patient tolerated treatment well    Behavior During Therapy [facility] for tasks assessed/performed           Past Medical History:  Diagnosis Date   Medical history non-contributory     Past Surgical History:  Procedure Laterality Date   NO PAST SURGERIES      There were no vitals filed for this visit.   Subjective Assessment - 09/10/20 0937    Pertinent History Lt ulnar styloid fx (injured early Spring, but wasn't diagnosed with fracture until 06/18/20. Bone stimulator ordered 06/22/20 and has been using since with great improvement. PMH: CTS, ? triggering long and ring finger    Limitations no lfiting > 10 lbs    Currently in Pain? Yes    Pain Score 6     Pain Location Wrist   ulnar side   Pain Orientation Left    Pain Descriptors / Indicators Aching    Pain Type Chronic pain    Pain Onset More than a month ago    Pain Frequency Intermittent    Aggravating Factors  use, picking up daughter    Pain Relieving Factors rest, ice              [facility] OT Assessment - 09/10/20 0001      Assessment   Medical Diagnosis Lt ulnar styloid    Referring Provider (OT) Dr. Gershon Mussel    Onset Date/Surgical Date --   Spring 2021, no surgery   Hand Dominance Left    Prior Therapy none      Precautions   Precautions Other (comment)    Precaution Comments no lifting > 10 lbs     Required Braces or Orthoses Other Brace/Splint    Other Brace/Splint pre fab wrist brace to wear at all times (except for ex's, showering, etc)       Balance Screen   Has the patient fallen in the past 6 months No      Home  Environment   Lives With Significant other;Daughter   1 y.o. 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      IADL   Shopping Takes care of all shopping needs  independently   orders online and boyfriend assist getting groceries in   [employer] light daily tasks but cannot maintain acceptable level of cleanliness    Meal Prep Plans, prepares and serves adequate meals independently   uses Rt hand to lift heavy pots/pans   Cabin crew Status Independent      Written Expression   Dominant Hand Left    Handwriting 100% legible      Vision - History   Baseline Vision No visual deficits      Observation/Other Assessments   Observations wears brace, pain w/ pronation 7/10 and with UD 8/10      Sensation   Additional Comments Pt reports occasionally small finger will go numb but has gotten better      Coordination   9 Hole Peg Test Right;Left    Right 9 Hole Peg Test 20.03 sec    Left 9 Hole Peg Test 19.88 sec      Edema   Edema none      ROM / Strength   AROM / PROM / Strength AROM      AROM   Overall AROM  Within functional limits for tasks performed     Overall AROM Comments forearm, wrist and hand ROM WNLs LUE      Hand Function   Right Hand Grip (lbs) 70.7 LBS    Left Hand Grip (lbs) 68.1 LBS                            OT Education - 09/10/20 1010    Education Details wrist AROM HEP, wrist isometric strengthening HEP    Person(s) Educated Patient    Methods Explanation;Demonstration;Handout    Comprehension Verbalized understanding;Returned demonstration               OT Long Term Goals - 09/10/20 1435      OT LONG TERM GOAL #1   Title Independent with HEP for Lt wrist    Time 6    Period Weeks    Status On-going      OT LONG TERM GOAL #2   Title Simulate work tasks (no > 10 lbs) with proper body mechanics and joint protection strategies    Time 6    Period Weeks    Status New      OT LONG TERM GOAL #3   Title Pain no greater than 4/10 with day to day tasks including motion in pronation and UD    Time 6    Period Weeks    Status New      OT LONG TERM GOAL #4   Title Grip strength Lt dominant hand to be 72 lbs or greater    Baseline 68 lbs (Rt = 70 lbs)    Time 6    Period Weeks    Status New                 Plan - 09/10/20 1429    Clinical Impression Statement Pt is a 24 y.o. female who presents to OPOT with Lt ulnar styloid fx mostly healed. Pt originally injured wrist in early Spring 2021 but was diagnosed as wrist tendonitis. Pt then felt a pop at work and was diagnosed with ulnar styloid fx on 06/18/20. MD ordered a bone stimulator on 06/22/20 and pt has noticed improvement in pain and function since then. Pt still  wears brace for most everything and still has pain with pronation and UD. Pt would benefit from O.T. to address pain and weakness    OT Occupational Profile and History Problem Focused Assessment - Including review of records relating to presenting problem    Occupational performance deficits (Please refer to evaluation for details): IADL's;Work;Leisure    Body Structure /  Function / Physical Skills Pain;UE functional use;Body mechanics;IADL;Strength    Rehab Potential Good    Clinical Decision Making Limited treatment options, no task modification necessary    Comorbidities Affecting Occupational Performance: None    Modification or Assistance to Complete Evaluation  No modification of tasks or assist necessary to complete eval    OT Frequency 1x / week    OT Duration 6 weeks    OT Treatment/Interventions Moist Heat;Fluidtherapy;DME and/or AE instruction;Splinting;Therapeutic activities;Therapeutic exercise;Cryotherapy;Passive range of motion;Patient/family education;Manual Therapy    Plan consider ice or fluido, review HEP    Consulted and Agree with Plan of Care Patient           Patient will benefit from skilled therapeutic intervention in order to improve the following deficits and impairments:   Body Structure / Function / Physical Skills: Pain, UE functional use, Body mechanics, IADL, Strength       Visit Diagnosis: Pain in left wrist  Muscle weakness (generalized)    Problem List Patient Active Problem List   Diagnosis Date Noted   Iron deficiency anemia due to chronic blood loss 06/26/2020   Vitamin D insufficiency 06/26/2020   Closed nondisplaced fracture of styloid process of left ulna 06/20/2020   Anxiety 05/23/2020   Left wrist tendonitis 05/22/2020   Otitis media 09/09/2019   Headache 03/26/2016    Kelli Churn, OTR/L 09/10/2020, 2:39 PM  [facility] [address] [place], [place], [address] Phone: [phone]   Fax:  [phone]  Name: Melissa Farmer MRN: [id number] Date of Birth: 01/26/1996

## 2020-09-10 NOTE — Patient Instructions (Signed)
  AROM: Wrist Extension   .  With _left___ palm down, bend wrist up. Repeat __15__ times per set.  Do __3__ sessions per day.    AROM: Wrist Flexion   With___left__ palm up, bend wrist up. Repeat __15__ times per set.  Do _3__ sessions per day.   AROM: Wrist Radial / Ulnar Deviation    Gently bend left wrist from side to side as far as possible. Repeat _10___ times per set. Do not go to extremes if painful. Do __3__ sessions per day.  Combination Movement (Active)    Keep elbow firmly at side with wrist straight.Turn palm up and down gently. Repeat __10__ times. Do _3___ sessions per day.   Wrist Extension: Isometric    With left forearm resting palm down on thigh, resist upward movement of hand with other hand. Hold __10__ seconds. Relax. Repeat __5__ times per set. Do __2__ sessions per day.  Wrist Radial Deviation: Isometric    With left forearm resting on thigh, thumb up, use other hand to resist upward movement of hand at wrist. Hold _10___ seconds. Relax. Repeat __5__ times per set. Do __2__ sessions per day.  Wrist Ulnar Deviation: Isometric    With left forearm resting on thigh, thumb up, use other hand to resist downward movement of hand at wrist. Hold __10__ seconds. Relax. Repeat _5___ times per set. Do __2__ sessions per day.  Flexion (Isometric)    With forearm held steady, palm up, use other hand to resist upward movement of hand at wrist. Hold _10___ seconds. Relax. Repeat _5___ times. Do __2__ sessions per day.

## 2020-09-19 ENCOUNTER — Other Ambulatory Visit: Payer: Self-pay

## 2020-09-19 ENCOUNTER — Ambulatory Visit: Payer: Medicaid Other | Admitting: Occupational Therapy

## 2020-09-20 ENCOUNTER — Ambulatory Visit: Payer: Medicaid Other | Attending: Nurse Practitioner | Admitting: Occupational Therapy

## 2020-09-20 ENCOUNTER — Encounter: Payer: Self-pay | Admitting: Nurse Practitioner

## 2020-09-20 ENCOUNTER — Ambulatory Visit (INDEPENDENT_AMBULATORY_CARE_PROVIDER_SITE_OTHER): Payer: Medicaid Other | Admitting: Nurse Practitioner

## 2020-09-20 VITALS — BP 118/70 | HR 68 | Temp 97.3°F | Ht 66.0 in | Wt 110.0 lb

## 2020-09-20 DIAGNOSIS — H6983 Other specified disorders of Eustachian tube, bilateral: Secondary | ICD-10-CM | POA: Diagnosis not present

## 2020-09-20 MED ORDER — FLUTICASONE PROPIONATE 50 MCG/ACT NA SUSP: 2.0000 | Freq: Every day | NASAL | 1 refills | Status: DC

## 2020-09-20 MED ORDER — LORATADINE 10 MG PO TABS: 10.0000 mg | ORAL_TABLET | Freq: Every day | ORAL | 11 refills | Status: DC

## 2020-09-20 NOTE — Patient Instructions (Addendum)
Use coconut oil or almond oil to moisturize ear canal.  Eustachian Tube Dysfunction  Eustachian tube dysfunction refers to a condition in which a blockage develops in the narrow passage that connects the middle ear to the back of the nose (eustachian tube). The eustachian tube regulates air pressure in the middle ear by letting air move between the ear and nose. It also helps to drain fluid from the middle ear space. Eustachian tube dysfunction can affect one or both ears. When the eustachian tube does not function properly, air pressure, fluid, or both can build up in the middle ear. What are the causes? This condition occurs when the eustachian tube becomes blocked or cannot open normally. Common causes of this condition include:  Ear infections.  Colds and other infections that affect the nose, mouth, and throat (upper respiratory tract).  Allergies.  Irritation from cigarette smoke.  Irritation from stomach acid coming up into the esophagus (gastroesophageal reflux). The esophagus is the tube that carries food from the mouth to the stomach.  Sudden changes in air pressure, such as from descending in an airplane or scuba diving.  Abnormal growths in the nose or throat, such as: ? Growths that line the nose (nasal polyps). ? Abnormal growth of cells (tumors). ? Enlarged tissue at the back of the throat (adenoids). What increases the risk? You are more likely to develop this condition if:  You smoke.  You are overweight.  You are a child who has: ? Certain birth defects of the mouth, such as cleft palate. ? Large tonsils or adenoids. What are the signs or symptoms? Common symptoms of this condition include:  A feeling of fullness in the ear.  Ear pain.  Clicking or popping noises in the ear.  Ringing in the ear.  Hearing loss.  Loss of balance.  Dizziness. Symptoms may get worse when the air pressure around you changes, such as when you travel to an area of high  elevation, fly on an airplane, or go scuba diving. How is this diagnosed? This condition may be diagnosed based on:  Your symptoms.  A physical exam of your ears, nose, and throat.  Tests, such as those that measure: ? The movement of your eardrum (tympanogram). ? Your hearing (audiometry). How is this treated? Treatment depends on the cause and severity of your condition.  In mild cases, you may relieve your symptoms by moving air into your ears. This is called "popping the ears."  In more severe cases, or if you have symptoms of fluid in your ears, treatment may include: ? Medicines to relieve congestion (decongestants). ? Medicines that treat allergies (antihistamines). ? Nasal sprays or ear drops that contain medicines that reduce swelling (steroids). ? A procedure to drain the fluid in your eardrum (myringotomy). In this procedure, a small tube is placed in the eardrum to:  Drain the fluid.  Restore the air in the middle ear space. ? A procedure to insert a balloon device through the nose to inflate the opening of the eustachian tube (balloon dilation). Follow these instructions at home: Lifestyle  Do not do any of the following until your health care provider approves: ? Travel to high altitudes. ? Fly in airplanes. ? Work in a Estate agent or room. ? Scuba dive.  Do not use any products that contain nicotine or tobacco, such as cigarettes and e-cigarettes. If you need help quitting, ask your health care provider.  Keep your ears dry. Wear fitted earplugs during showering and  bathing. Dry your ears completely after. General instructions  Take over-the-counter and prescription medicines only as told by your health care provider.  Use techniques to help pop your ears as recommended by your health care provider. These may include: ? Chewing gum. ? Yawning. ? Frequent, forceful swallowing. ? Closing your mouth, holding your nose closed, and gently blowing as if  you are trying to blow air out of your nose.  Keep all follow-up visits as told by your health care provider. This is important. Contact a health care provider if:  Your symptoms do not go away after treatment.  Your symptoms come back after treatment.  You are unable to pop your ears.  You have: ? A fever. ? Pain in your ear. ? Pain in your head or neck. ? Fluid draining from your ear.  Your hearing suddenly changes.  You become very dizzy.  You lose your balance. Summary  Eustachian tube dysfunction refers to a condition in which a blockage develops in the eustachian tube.  It can be caused by ear infections, allergies, inhaled irritants, or abnormal growths in the nose or throat.  Symptoms include ear pain, hearing loss, or ringing in the ears.  Mild cases are treated with maneuvers to unblock the ears, such as yawning or ear popping.  Severe cases are treated with medicines. Surgery may also be done (rare). This information is not intended to replace advice given to you by your health care provider. Make sure you discuss any questions you have with your health care provider. Document Revised: 02/23/2018 Document Reviewed: 02/23/2018 Elsevier Patient Education  [employer].

## 2020-09-20 NOTE — Progress Notes (Signed)
   Subjective:  Patient ID: Melissa Farmer, female    DOB: 1996-06-29  Age: 24 y.o. MRN: [id number]  CC: Acute Visit (Right ear pain x1-2 months. Pt states it will itch and then starting aching very bad.)  Otalgia  There is pain in the right ear. This is a recurrent problem. The current episode started 1 to 4 weeks ago. The problem occurs constantly. The problem has been waxing and waning. There has been no fever. Pertinent negatives include no abdominal pain, coughing, diarrhea, ear discharge, hearing loss, neck pain, rash, rhinorrhea, sore throat or vomiting. She has tried nothing for the symptoms. There is no history of a chronic ear infection, hearing loss or a tympanostomy tube.   Reviewed past Medical, Social and Family history today.  Outpatient Medications Prior to Visit  Medication Sig Dispense Refill  . ibuprofen (ADVIL) 600 MG tablet Take 1 tablet (600 mg total) by mouth every 8 (eight) hours as needed (with food). 30 tablet 0  . norgestimate-ethinyl estradiol (ORTHO-CYCLEN) 0.25-35 MG-MCG tablet Take 1 tablet by mouth daily. 1 Package 11  . busPIRone (BUSPAR) 7.5 MG tablet Take 1 tablet (7.5 mg total) by mouth 2 (two) times daily. (Patient not taking: Reported on 09/10/2020) 60 tablet 5   No facility-administered medications prior to visit.    ROS See HPI  Objective:  BP 118/70 (BP Location: Left Arm, Patient Position: Sitting, Cuff Size: Normal)   Pulse 68   Temp (!) 97.3 F (36.3 C) (Temporal)   Ht 5\' 6"  (1.676 m)   Wt 110 lb (49.9 kg)   SpO2 98%   BMI 17.75 kg/m   Physical Exam HENT:     Right Ear: Ear canal and external ear normal. No drainage, swelling or tenderness. A middle ear effusion is present. There is no impacted cerumen. No mastoid tenderness. Tympanic membrane is not perforated, erythematous or retracted.     Left Ear: Ear canal and external ear normal. No drainage, swelling or tenderness. A middle ear effusion is present. There is no impacted  cerumen. No mastoid tenderness. Tympanic membrane is not perforated, erythematous or retracted.  Pulmonary:     Effort: Pulmonary effort is normal.  Musculoskeletal:     Cervical back: Normal range of motion and neck supple.  Lymphadenopathy:     Cervical: No cervical adenopathy.  Skin:    Findings: No erythema or rash.  Neurological:     Mental Status: She is alert and oriented to person, place, and time.    Assessment & Plan:  This visit occurred during the SARS-CoV-2 public health emergency.  Safety protocols were in place, including screening questions prior to the visit, additional usage of staff PPE, and extensive cleaning of exam room while observing appropriate contact time as indicated for disinfecting solutions.   [name] was seen today for acute visit.  Diagnoses and all orders for this visit:  Eustachian tube dysfunction, bilateral -     fluticasone (FLONASE) 50 MCG/ACT nasal spray; Place 2 sprays into both nostrils daily. -     loratadine (CLARITIN) 10 MG tablet; Take 1 tablet (10 mg total) by mouth daily.   Problem List Items Addressed This Visit    None    Visit Diagnoses    Eustachian tube dysfunction, bilateral    -  Primary   Relevant Medications   fluticasone (FLONASE) 50 MCG/ACT nasal spray   loratadine (CLARITIN) 10 MG tablet      Follow-up: No follow-ups on file.  Adela Lank, NP

## 2020-09-21 ENCOUNTER — Ambulatory Visit: Payer: Medicaid Other | Admitting: Orthopaedic Surgery

## 2020-09-24 ENCOUNTER — Ambulatory Visit: Payer: Medicaid Other | Admitting: Occupational Therapy

## 2020-09-24 ENCOUNTER — Telehealth: Payer: Self-pay

## 2020-09-24 NOTE — Telephone Encounter (Signed)
Called and left message re: missed appointment today and prior missed appointments. Pt was reminded of no show policy and next scheduled appointment. Therapist also left call back number if she needed to cancel future appointments.

## 2020-10-02 ENCOUNTER — Ambulatory Visit: Payer: Medicaid Other | Admitting: Occupational Therapy

## 2020-10-08 ENCOUNTER — Ambulatory Visit: Payer: Medicaid Other | Admitting: Occupational Therapy

## 2020-10-16 ENCOUNTER — Other Ambulatory Visit: Payer: Self-pay

## 2020-10-16 ENCOUNTER — Encounter (HOSPITAL_COMMUNITY): Payer: Self-pay | Admitting: Emergency Medicine

## 2020-10-16 ENCOUNTER — Ambulatory Visit (INDEPENDENT_AMBULATORY_CARE_PROVIDER_SITE_OTHER): Payer: Medicaid Other

## 2020-10-16 ENCOUNTER — Ambulatory Visit (HOSPITAL_COMMUNITY)
Admission: EM | Admit: 2020-10-16 | Discharge: 2020-10-16 | Disposition: A | Discharge status: Home / Self Care | Payer: Medicaid Other | Attending: Family Medicine | Admitting: Family Medicine

## 2020-10-16 DIAGNOSIS — W108XXA Fall (on) (from) other stairs and steps, initial encounter: Secondary | ICD-10-CM

## 2020-10-16 DIAGNOSIS — M79642 Pain in left hand: Secondary | ICD-10-CM

## 2020-10-16 DIAGNOSIS — S60222A Contusion of left hand, initial encounter: Secondary | ICD-10-CM

## 2020-10-16 NOTE — ED Triage Notes (Signed)
Pt presents with left hand/ finger injury. States had broken hand in past and fell down steps today and is unable to move hand at this time.

## 2020-10-17 NOTE — ED Provider Notes (Signed)
[facility]   [id number] 10/16/20 Arrival Time: 1620  ASSESSMENT & PLAN:  1. Left hand pain   2. Contusion of left hand, initial encounter     I have personally viewed the imaging studies ordered this visit. No fractures appreciated.  Prefers OTC ibuprofen as needed.  Orders Placed This Encounter  Procedures  . DG Hand Complete Left    Recommend:  Follow-up Information    [facility].   Why: If worsening or failing to improve as anticipated. Contact information: [address] [place] [address] [phone]              Reviewed expectations re: course of current medical issues. Questions answered. Outlined signs and symptoms indicating need for more acute intervention. Patient verbalized understanding. After Visit Summary given.  SUBJECTIVE: History from: patient. Melissa Farmer is a 24 y.o. female who reports L hand injury; fall on stairs; hit hand; immediate and continuing pain that is worse with attempted finger movement. No extremity sensation changes or weakness. Ibuprofen with some relief.   Past Surgical History:  Procedure Laterality Date  . NO PAST SURGERIES        OBJECTIVE:  Vitals:   10/16/20 1808  BP: 121/62  Pulse: 61  Resp: 17  Temp: 98.9 F (37.2 C)  TempSrc: Oral  SpO2: 100%    General appearance: alert; no distress HEENT: [place]; AT Neck: supple with FROM Resp: unlabored respirations Extremities: . LUE: warm with well perfused appearance; poorly localized moderate tenderness over left dorsal distal hand; without gross deformities; swelling: minimal; bruising: none; wrist ROM: normal CV: brisk extremity capillary refill of LUE; 2+ radial pulse of LUE. Skin: warm and dry; no visible rashes Neurologic: gait normal; normal sensation and strength of LUE Psychological: alert and cooperative; normal mood and affect  Imaging: DG Hand Complete Left  Result Date:  10/16/2020 CLINICAL DATA:  Trauma, left hand and finger injury EXAM: LEFT HAND - COMPLETE 3+ VIEW COMPARISON:  Radiographs 08/10/2020 FINDINGS: No acute bony abnormality. Specifically, no fracture, subluxation, or dislocation. Remote posttraumatic deformity of the ulnar styloid process. Soft tissues are unremarkable without significant swelling, gas or foreign body. No significant age advanced arthrosis. IMPRESSION: No acute fracture or traumatic malalignment. Remote posttraumatic deformity of the ulnar styloid process. Electronically Signed   By: Kreg Shropshire M.D.   On: 10/16/2020 18:58      No Known Allergies  Past Medical History:  Diagnosis Date  . Medical history non-contributory    Social History   Socioeconomic History  . Marital status: Single    Spouse name: Not on file  . Number of children: Not on file  . Years of education: Not on file  . Highest education level: Not on file  Occupational History  . Not on file  Tobacco Use  . Smoking status: Never Smoker  . Smokeless tobacco: Never Used  Vaping Use  . Vaping Use: Never used  Substance and Sexual Activity  . Alcohol use: No    Alcohol/week: 0.0 standard drinks  . Drug use: No  . Sexual activity: Yes    Birth control/protection: None  Other Topics Concern  . 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   Social Determinants of Health   Financial Resource Strain:   . Difficulty of Paying Living Expenses: Not on file  Food Insecurity:   . Worried About [employer] of  Food in the Last Year: Not on file  . Ran Out of Food in the Last Year: Not on file  Transportation Needs:   . Lack of Transportation (Medical): Not on file  . Lack of Transportation (Non-Medical): Not on file  Physical Activity:   . Days of Exercise per Week: Not on file  . Minutes of Exercise per Session: Not on file  Stress:   . Feeling of Stress : Not on file  Social Connections:   .  Frequency of Communication with Friends and Family: Not on file  . Frequency of Social Gatherings with Friends and Family: Not on file  . Attends Religious Services: Not on file  . Active Member of Clubs or Organizations: Not on file  . Attends Banker Meetings: Not on file  . Marital Status: Not on file   Family History  Problem Relation Age of Onset  . Kidney disease Maternal Grandmother   . Stroke Maternal Grandfather   . Diabetes Maternal Grandfather   . Heart disease Maternal Grandfather   . Cancer Paternal Grandmother   . Asthma Paternal Grandmother   . Asthma Father    Past Surgical History:  Procedure Laterality Date  . NO PAST SURGERIES        Mardella Layman, MD 10/17/20 [phone]

## 2020-12-21 ENCOUNTER — Ambulatory Visit (INDEPENDENT_AMBULATORY_CARE_PROVIDER_SITE_OTHER): Payer: Medicaid Other | Admitting: Nurse Practitioner

## 2020-12-21 ENCOUNTER — Other Ambulatory Visit: Payer: Self-pay

## 2020-12-21 ENCOUNTER — Encounter: Payer: Self-pay | Admitting: Nurse Practitioner

## 2020-12-21 VITALS — BP 102/68 | Temp 98.0°F | Ht 66.0 in | Wt 113.2 lb

## 2020-12-21 DIAGNOSIS — H6983 Other specified disorders of Eustachian tube, bilateral: Secondary | ICD-10-CM | POA: Diagnosis not present

## 2020-12-21 DIAGNOSIS — H9202 Otalgia, left ear: Secondary | ICD-10-CM | POA: Diagnosis not present

## 2020-12-21 MED ORDER — FLUTICASONE PROPIONATE 50 MCG/ACT NA SUSP: 2.0000 | Freq: Every day | NASAL | 1 refills | Status: DC

## 2020-12-21 MED ORDER — CETIRIZINE HCL 10 MG PO TABS: 10.0000 mg | ORAL_TABLET | Freq: Every day | ORAL | 0 refills | Status: DC

## 2020-12-21 NOTE — Progress Notes (Signed)
Subjective:  Patient ID: Melissa Farmer, female    DOB: 30-Dec-1995  Age: 25 y.o. MRN: [id number]  CC: Acute Visit (Pt c/o right ear itching x2-3 months, along with aching. )  Otalgia  There is pain in the left ear. This is a recurrent problem. The current episode started more than 1 month ago. The problem occurs constantly. The problem has been waxing and waning. There has been no fever. Pertinent negatives include no coughing, ear discharge, headaches, hearing loss, neck pain, rash, rhinorrhea or sore throat. She has tried ear drops (flonase and claritin x 24month) for the symptoms. The treatment provided mild relief. There is no history of a chronic ear infection, hearing loss or a tympanostomy tube.   Reviewed past Medical, Social and Family history today.  Outpatient Medications Prior to Visit  Medication Sig Dispense Refill  . ibuprofen (ADVIL) 600 MG tablet Take 1 tablet (600 mg total) by mouth every 8 (eight) hours as needed (with food). 30 tablet 0  . norgestimate-ethinyl estradiol (ORTHO-CYCLEN) 0.25-35 MG-MCG tablet Take 1 tablet by mouth daily. 1 Package 11  . fluticasone (FLONASE) 50 MCG/ACT nasal spray Place 2 sprays into both nostrils daily. 16 g 1  . loratadine (CLARITIN) 10 MG tablet Take 1 tablet (10 mg total) by mouth daily. 30 tablet 11   No facility-administered medications prior to visit.    ROS See HPI  Objective:  BP 102/68 (BP Location: Left Arm, Patient Position: Sitting, Cuff Size: Normal)   Temp 98 F (36.7 C) (Temporal)   Ht 5\' 6"  (1.676 m)   Wt 113 lb 3.2 oz (51.3 kg)   SpO2 98%   BMI 18.27 kg/m   Physical Exam HENT:     Right Ear: Ear canal and external ear normal. No tenderness. A middle ear effusion is present. There is no impacted cerumen. No foreign body. No mastoid tenderness. Tympanic membrane is not injected, scarred, perforated, erythematous, retracted or bulging.     Left Ear: Ear canal and external ear normal. No tenderness. A middle  ear effusion is present. There is no impacted cerumen. No foreign body. No mastoid tenderness. Tympanic membrane is not injected, scarred, perforated, erythematous, retracted or bulging.  Musculoskeletal:     Cervical back: Normal range of motion and neck supple.  Lymphadenopathy:     Cervical: No cervical adenopathy.  Neurological:     Mental Status: She is alert.    Assessment & Plan:  This visit occurred during the SARS-CoV-2 public health emergency.  Safety protocols were in place, including screening questions prior to the visit, additional usage of staff PPE, and extensive cleaning of exam room while observing appropriate contact time as indicated for disinfecting solutions.   [name] was seen today for acute visit.  Diagnoses and all orders for this visit:  Left ear pain -     fluticasone (FLONASE) 50 MCG/ACT nasal spray; Place 2 sprays into both nostrils daily. -     cetirizine (ZYRTEC) 10 MG tablet; Take 1 tablet (10 mg total) by mouth daily. -     Ambulatory referral to ENT  Eustachian tube dysfunction, bilateral -     fluticasone (FLONASE) 50 MCG/ACT nasal spray; Place 2 sprays into both nostrils daily. -     cetirizine (ZYRTEC) 10 MG tablet; Take 1 tablet (10 mg total) by mouth daily. -     Ambulatory referral to ENT   Problem List Items Addressed This Visit   None   Visit Diagnoses  Left ear pain    -  Primary   Relevant Medications   fluticasone (FLONASE) 50 MCG/ACT nasal spray   cetirizine (ZYRTEC) 10 MG tablet   Other Relevant Orders   Ambulatory referral to ENT   Eustachian tube dysfunction, bilateral       Relevant Medications   fluticasone (FLONASE) 50 MCG/ACT nasal spray   cetirizine (ZYRTEC) 10 MG tablet   Other Relevant Orders   Ambulatory referral to ENT      Follow-up: No follow-ups on file.  Melissa Penna, NP

## 2020-12-21 NOTE — Patient Instructions (Signed)
Resume flonase Change claritin to zrytec You will be contacted to schedule an appt with ENT.  Earache, Adult An earache, or ear pain, can be caused by many things, including:  An infection.  Ear wax buildup.  Ear pressure.  Something in the ear that should not be there (foreign body).  A sore throat.  Tooth problems.  Jaw problems. Treatment of the earache will depend on the cause. If the cause is not clear or cannot be determined, you may need to watch your symptoms until your earache goes away or until a cause is found. Follow these instructions at home: Medicines  Take or apply over-the-counter and prescription medicines only as told by your health care provider.  If you were prescribed an antibiotic medicine, use it as told by your health care provider. Do not stop using the antibiotic even if you start to feel better.  Do not put anything in your ear other than medicine that is prescribed by your health care provider. Managing pain If directed, apply heat to the affected area as often as told by your health care provider. Use the heat source that your health care provider recommends, such as a moist heat pack or a heating pad.  Place a towel between your skin and the heat source.  Leave the heat on for 20-30 minutes.  Remove the heat if your skin turns bright red. This is especially important if you are unable to feel pain, heat, or cold. You may have a greater risk of getting burned. If directed, put ice on the affected area as often as told by your health care provider. To do this:  Put ice in a plastic bag.  Place a towel between your skin and the bag.  Leave the ice on for 20 minutes, 2-3 times a day.      General instructions  Pay attention to any changes in your symptoms.  Try resting in an upright position instead of lying down. This may help to reduce pressure in your ear and relieve pain.  Chew gum if it helps to relieve your ear pain.  Treat any  allergies as told by your health care provider.  Drink enough fluid to keep your urine pale yellow.  It is up to you to get the results of any tests that were done. Ask your health care provider, or the department that is doing the tests, when your results will be ready.  Keep all follow-up visits as told by your health care provider. This is important. Contact a health care provider if:  Your pain does not improve within 2 days.  Your earache gets worse.  You have new symptoms.  You have a fever. Get help right away if you:  Have a severe headache.  Have a stiff neck.  Have trouble swallowing.  Have redness or swelling behind your ear.  Have fluid or blood coming from your ear.  Have hearing loss.  Feel dizzy. Summary  An earache, or ear pain, can be caused by many things.  Treatment of the earache will depend on the cause. Follow recommendations from your health care provider to treat your ear pain.  If the cause is not clear or cannot be determined, you may need to watch your symptoms until your earache goes away or until a cause is found.  Keep all follow-up visits as told by your health care provider. This is important. This information is not intended to replace advice given to you by your health  care provider. Make sure you discuss any questions you have with your health care provider. Document Revised: 06/11/2019 Document Reviewed: 06/11/2019 Elsevier Patient Education  2021 [employer].

## 2021-01-11 ENCOUNTER — Encounter (HOSPITAL_COMMUNITY): Payer: Self-pay

## 2021-01-11 ENCOUNTER — Ambulatory Visit (HOSPITAL_COMMUNITY)
Admission: RE | Admit: 2021-01-11 | Discharge: 2021-01-11 | Disposition: A | Discharge status: Home / Self Care | Payer: Medicaid Other | Source: Ambulatory Visit | Attending: Student | Admitting: Student

## 2021-01-11 ENCOUNTER — Other Ambulatory Visit: Payer: Self-pay

## 2021-01-11 VITALS — BP 118/64 | HR 61 | Temp 98.5°F | Resp 18

## 2021-01-11 DIAGNOSIS — R109 Unspecified abdominal pain: Secondary | ICD-10-CM

## 2021-01-11 DIAGNOSIS — N939 Abnormal uterine and vaginal bleeding, unspecified: Secondary | ICD-10-CM | POA: Diagnosis not present

## 2021-01-11 DIAGNOSIS — Z3202 Encounter for pregnancy test, result negative: Secondary | ICD-10-CM | POA: Insufficient documentation

## 2021-01-11 DIAGNOSIS — N920 Excessive and frequent menstruation with regular cycle: Secondary | ICD-10-CM | POA: Insufficient documentation

## 2021-01-11 DIAGNOSIS — Z3009 Encounter for other general counseling and advice on contraception: Secondary | ICD-10-CM | POA: Insufficient documentation

## 2021-01-11 DIAGNOSIS — Z113 Encounter for screening for infections with a predominantly sexual mode of transmission: Secondary | ICD-10-CM | POA: Insufficient documentation

## 2021-01-11 LAB — POCT URINALYSIS DIPSTICK, ED / UC
Bilirubin Urine: NEGATIVE
Glucose, UA: NEGATIVE mg/dL
Ketones, ur: NEGATIVE mg/dL
Leukocytes,Ua: NEGATIVE
Nitrite: NEGATIVE
Protein, ur: NEGATIVE mg/dL
Specific Gravity, Urine: 1.025 (ref 1.005–1.030)
Urobilinogen, UA: 0.2 mg/dL (ref 0.0–1.0)
pH: 6.5 (ref 5.0–8.0)

## 2021-01-11 LAB — POC URINE PREG, ED: Preg Test, Ur: NEGATIVE

## 2021-01-11 NOTE — ED Triage Notes (Signed)
Pt presents with generalized abdominal cramping and abnormal vaginal bleeding X 4 days.  Pt would like STD Testing.

## 2021-01-11 NOTE — Discharge Instructions (Addendum)
-  For now, continue birth control as directed. Do follow-up with your PCP or gyn to discuss alternative method, as I suspect heavy period is related to this.  -If your bleeding gets worse instead of better; if you develop worsening of abd pain; new fevers/chills; shortness of breath; dizziness; etc- seek immediate medical attention.

## 2021-01-11 NOTE — ED Provider Notes (Signed)
[facility]    CSN: [id number] Arrival date & time: 01/11/21  1312      History   Chief Complaint Chief Complaint  Patient presents with  . APPOINTMENT: Abdominal Pain, Abnormal Vaginal Bleeding    HPI Melissa Farmer is a 25 y.o. female presenting with generalized abdominal cramping and abnormal vaginal bleeding x4 days. States she's been on her period for 4 days and it's been unusually heavy. This is her normal cycle. States today period is getting lighter on its own but she is concerned at how heavy it was. States she was started on new OCP 2 months ago by PCP. She was previously on Depo and bled for 8 straight months on this. She is also requesting STI testing though she denies new partner, denies STI symptoms. Denies hematuria, dysuria, frequency, urgency, back pain, n/v/d, fevers/chills, abdnormal vaginal discharge.  HPI  Past Medical History:  Diagnosis Date  . Medical history non-contributory     Patient Active Problem List   Diagnosis Date Noted  . Iron deficiency anemia due to chronic blood loss 06/26/2020  . Vitamin D insufficiency 06/26/2020  . Closed nondisplaced fracture of styloid process of left ulna 06/20/2020  . Anxiety 05/23/2020  . Left wrist tendonitis 05/22/2020  . Otitis media 09/09/2019  . Headache 03/26/2016    Past Surgical History:  Procedure Laterality Date  . NO PAST SURGERIES      OB History    Gravida  1   Para  1   Term  1   Preterm      AB      Living  1     SAB      IAB      Ectopic      Multiple  0   Live Births  1            Home Medications    Prior to Admission medications   Medication Sig Start Date End Date Taking? Authorizing Provider  cetirizine (ZYRTEC) 10 MG tablet Take 1 tablet (10 mg total) by mouth daily. 12/21/20   Nche, Bonna Gains, NP  fluticasone (FLONASE) 50 MCG/ACT nasal spray Place 2 sprays into both nostrils daily. 12/21/20   Nche, Bonna Gains, NP  ibuprofen (ADVIL) 600  MG tablet Take 1 tablet (600 mg total) by mouth every 8 (eight) hours as needed (with food). 06/20/20   Nche, Bonna Gains, NP  norgestimate-ethinyl estradiol (ORTHO-CYCLEN) 0.25-35 MG-MCG tablet Take 1 tablet by mouth daily. 12/26/19   Currie Paris, NP    Family History Family History  Problem Relation Age of Onset  . Kidney disease Maternal Grandmother   . Stroke Maternal Grandfather   . Diabetes Maternal Grandfather   . Heart disease Maternal Grandfather   . Cancer Paternal Grandmother   . Asthma Paternal Grandmother   . Asthma Father     Social History Social History   Tobacco Use  . Smoking status: Never Smoker  . Smokeless tobacco: Never Used  Vaping Use  . Vaping Use: Never used  Substance Use Topics  . Alcohol use: No    Alcohol/week: 0.0 standard drinks  . Drug use: No     Allergies   Patient has no known allergies.   Review of Systems Review of Systems  Constitutional: Negative for chills and fever.  HENT: Negative for sore throat.   Eyes: Negative for pain and redness.  Respiratory: Negative for shortness of breath.   Cardiovascular: Negative for chest pain.  Gastrointestinal: Positive for abdominal pain. Negative for diarrhea, nausea and vomiting.  Genitourinary: Positive for vaginal bleeding. Negative for decreased urine volume, difficulty urinating, dysuria, flank pain, frequency, genital sores, hematuria, menstrual problem, pelvic pain, urgency, vaginal discharge and vaginal pain.  Musculoskeletal: Negative for back pain.  Skin: Negative for rash.     Physical Exam Triage Vital Signs ED Triage Vitals  Enc Vitals Group     BP 01/11/21 1344 118/64     Pulse Rate 01/11/21 1344 61     Resp 01/11/21 1344 18     Temp 01/11/21 1344 98.5 F (36.9 C)     Temp Source 01/11/21 1344 Oral     SpO2 01/11/21 1344 100 %     Weight --      Height --      Head Circumference --      Peak Flow --      Pain Score 01/11/21 1345 6     Pain Loc --      Pain  Edu? --      Excl. in GC? --    No data found.  Updated Vital Signs BP 118/64 (BP Location: Left Arm)   Pulse 61   Temp 98.5 F (36.9 C) (Oral)   Resp 18   LMP 01/08/2021   SpO2 100%   Visual Acuity Right Eye Distance:   Left Eye Distance:   Bilateral Distance:    Right Eye Near:   Left Eye Near:    Bilateral Near:     Physical Exam Vitals reviewed.  Constitutional:      General: She is not in acute distress.    Appearance: Normal appearance. She is not ill-appearing.  HENT:     Head: Normocephalic and atraumatic.  Cardiovascular:     Rate and Rhythm: Normal rate and regular rhythm.     Heart sounds: Normal heart sounds.  Pulmonary:     Effort: Pulmonary effort is normal.     Breath sounds: Normal breath sounds. No wheezing, rhonchi or rales.  Abdominal:     General: Bowel sounds are normal. There is no distension.     Palpations: Abdomen is soft. There is no mass.     Tenderness: There is no abdominal tenderness. There is no right CVA tenderness, left CVA tenderness, guarding or rebound.  Neurological:     General: No focal deficit present.     Mental Status: She is alert and oriented to person, place, and time.  Psychiatric:        Mood and Affect: Mood normal.        Behavior: Behavior normal.      UC Treatments / Results  Labs (all labs ordered are listed, but only abnormal results are displayed) Labs Reviewed  POCT URINALYSIS DIPSTICK, ED / UC - Abnormal; Notable for the following components:      Result Value   Hgb urine dipstick SMALL (*)    All other components within normal limits  URINE CULTURE  POC URINE PREG, ED  CERVICOVAGINAL ANCILLARY ONLY    EKG   Radiology No results found.  Procedures Procedures (including critical care time)  Medications Ordered in UC Medications - No data to display  Initial Impression / Assessment and Plan / UC Course  I have reviewed the triage vital signs and the nursing notes.  Pertinent labs &  imaging results that were available during my care of the patient were reviewed by me and considered in my medical decision making (see chart  for details).      This patient is a 25 year old female presenting with vaginal bleeding and crampy abd pain. Today is day 4 of her period, and she states this is getting lighter on its own. She was recently started on ortho-cyclen OCP 2 months ago by PCP.   Benign exam. UA with small blood, otherwise wnl. Culture sent.  Urine pregnancy negative. Will send for G/C, trich, yeast, BV testing. Declines HIV/RPR.   Rec f/u with PCP to discuss alternative birth control method. I suspect menorrhagia is related to OCP given this was started recently. If symptoms worsen/persist in the next 2-3 days, she can also follow-up with [place] and I would consider Megace at that time. If focal abd pain, lightheadedness, etc- head straight to ED.  Spent over 40 minutes obtaining H&P, performing physical, discussing results, treatment plan and plan for follow-up with patient. Patient agrees with plan.    Final Clinical Impressions(s) / UC Diagnoses   Final diagnoses:  Menorrhagia with regular cycle  Encounter for general counseling and advice on contraceptive management  Routine screening for STI (sexually transmitted infection)  Negative pregnancy test     Discharge Instructions     -For now, continue birth control as directed. Do follow-up with your PCP or gyn to discuss alternative method, as I suspect heavy period is related to this.  -If your bleeding gets worse instead of better; if you develop worsening of abd pain; new fevers/chills; shortness of breath; dizziness; etc- seek immediate medical attention.    ED Prescriptions    None     PDMP not reviewed this encounter.   Rhys Martini, PA-C 01/11/21 1506

## 2021-01-13 LAB — URINE CULTURE: Culture: <10000 — AB

## 2021-01-14 LAB — CERVICOVAGINAL ANCILLARY ONLY
Bacterial Vaginitis (gardnerella): NEGATIVE
Candida Glabrata: NEGATIVE
Candida Vaginitis: NEGATIVE
Chlamydia: NEGATIVE
Comment: NEGATIVE
Comment: NEGATIVE
Comment: NEGATIVE
Comment: NEGATIVE
Comment: NEGATIVE
Comment: NORMAL
Neisseria Gonorrhea: NEGATIVE
Trichomonas: NEGATIVE

## 2021-02-07 ENCOUNTER — Ambulatory Visit (HOSPITAL_COMMUNITY)
Admission: EM | Admit: 2021-02-07 | Discharge: 2021-02-07 | Disposition: A | Discharge status: Home / Self Care | Payer: Medicaid Other | Attending: Family Medicine | Admitting: Family Medicine

## 2021-02-07 ENCOUNTER — Other Ambulatory Visit: Payer: Self-pay

## 2021-02-07 ENCOUNTER — Encounter (HOSPITAL_COMMUNITY): Payer: Self-pay

## 2021-02-07 DIAGNOSIS — N898 Other specified noninflammatory disorders of vagina: Secondary | ICD-10-CM | POA: Diagnosis not present

## 2021-02-07 DIAGNOSIS — Z202 Contact with and (suspected) exposure to infections with a predominantly sexual mode of transmission: Secondary | ICD-10-CM | POA: Diagnosis not present

## 2021-02-07 LAB — POCT URINALYSIS DIPSTICK, ED / UC
Bilirubin Urine: NEGATIVE
Glucose, UA: NEGATIVE mg/dL
Ketones, ur: NEGATIVE mg/dL
Leukocytes,Ua: NEGATIVE
Nitrite: NEGATIVE
Protein, ur: NEGATIVE mg/dL
Specific Gravity, Urine: >=1.03 (ref 1.005–1.030)
Urobilinogen, UA: 0.2 mg/dL (ref 0.0–1.0)
pH: 6.5 (ref 5.0–8.0)

## 2021-02-07 LAB — POC URINE PREG, ED: Preg Test, Ur: NEGATIVE

## 2021-02-07 MED ORDER — DOXYCYCLINE HYCLATE 100 MG PO CAPS: 100.0000 mg | ORAL_CAPSULE | Freq: Two times a day (BID) | ORAL | 0 refills | Status: AC

## 2021-02-07 MED ORDER — FLUCONAZOLE 150 MG PO TABS: 150.0000 mg | ORAL_TABLET | Freq: Once | ORAL | 0 refills | Status: AC

## 2021-02-07 NOTE — ED Triage Notes (Signed)
Pt presents for STD's testing. Reports boyfriend tested positive for Chlamydia yesterday. Pt reports abdominal cramps and vaginal itchiness x 2 days.

## 2021-02-07 NOTE — ED Provider Notes (Addendum)
[facility]    CSN: [id number] Arrival date & time: 02/07/21  1812      History   Chief Complaint Chief Complaint  Patient presents with  . Exposure to STD    HPI Melissa Farmer is a 25 y.o. female.   HPI Patient presents today for exposure to chlamydia.  Patient reports her boyfriend tested positive for chlamydia x1 day ago.  She is symptomatic of vaginal irritation and itching only.  She is also currently on her menstrual cycle.  She denies any pain with urination.  She is experiencing mild abdominal cramping.  No nausea, vomiting or fever. Past Medical History:  Diagnosis Date  . Medical history non-contributory     Patient Active Problem List   Diagnosis Date Noted  . Iron deficiency anemia due to chronic blood loss 06/26/2020  . Vitamin D insufficiency 06/26/2020  . Closed nondisplaced fracture of styloid process of left ulna 06/20/2020  . Anxiety 05/23/2020  . Left wrist tendonitis 05/22/2020  . Otitis media 09/09/2019  . Headache 03/26/2016    Past Surgical History:  Procedure Laterality Date  . NO PAST SURGERIES      OB History    Gravida  1   Para  1   Term  1   Preterm      AB      Living  1     SAB      IAB      Ectopic      Multiple  0   Live Births  1            Home Medications    Prior to Admission medications   Medication Sig Start Date End Date Taking? Authorizing Provider  cetirizine (ZYRTEC) 10 MG tablet Take 1 tablet (10 mg total) by mouth daily. 12/21/20   Nche, Bonna Gains, NP  fluticasone (FLONASE) 50 MCG/ACT nasal spray Place 2 sprays into both nostrils daily. 12/21/20   Nche, Bonna Gains, NP  ibuprofen (ADVIL) 600 MG tablet Take 1 tablet (600 mg total) by mouth every 8 (eight) hours as needed (with food). 06/20/20   Nche, Bonna Gains, NP  norgestimate-ethinyl estradiol (ORTHO-CYCLEN) 0.25-35 MG-MCG tablet Take 1 tablet by mouth daily. 12/26/19   Currie Paris, NP    Family History Family  History  Problem Relation Age of Onset  . Kidney disease Maternal Grandmother   . Stroke Maternal Grandfather   . Diabetes Maternal Grandfather   . Heart disease Maternal Grandfather   . Cancer Paternal Grandmother   . Asthma Paternal Grandmother   . Asthma Father     Social History Social History   Tobacco Use  . Smoking status: Never Smoker  . Smokeless tobacco: Never Used  Vaping Use  . Vaping Use: Never used  Substance Use Topics  . Alcohol use: No    Alcohol/week: 0.0 standard drinks  . Drug use: No     Allergies   Patient has no known allergies.   Review of Systems Review of Systems Pertinent negatives listed in HPI   Physical Exam Triage Vital Signs ED Triage Vitals  Enc Vitals Group     BP      Pulse      Resp      Temp      Temp src      SpO2      Weight      Height      Head Circumference      Peak  Flow      Pain Score      Pain Loc      Pain Edu?      Excl. in GC?    No data found.  Updated Vital Signs LMP 01/08/2021   Visual Acuity Right Eye Distance:   Left Eye Distance:   Bilateral Distance:    Right Eye Near:   Left Eye Near:    Bilateral Near:     Physical Exam General appearance: alert, well developed, well nourished, cooperative  Head: Normocephalic, without obvious abnormality, atraumatic Respiratory: Respirations even and unlabored, normal respiratory rate Heart: rate and rhythm normal. No gallop or murmurs noted on exam  Skin: Skin color, texture, turgor normal. No rashes seen  Psych: Appropriate mood and affect. Neurologic: Mental status: Alert, oriented to person, place, and time, thought content appropriate.  Vaginal cytology self collected. UC Treatments / Results  Labs (all labs ordered are listed, but only abnormal results are displayed) Labs Reviewed  POCT URINALYSIS DIPSTICK, ED / UC  POC URINE PREG, ED  CERVICOVAGINAL ANCILLARY ONLY    EKG   Radiology No results found.  Procedures Procedures  (including critical care time)  Medications Ordered in UC Medications - No data to display  Initial Impression / Assessment and Plan / UC Course  I have reviewed the triage vital signs and the nursing notes.  Pertinent labs & imaging results that were available during my care of the patient were reviewed by me and considered in my medical decision making (see chart for details).    Exposure to chlamydia will cover today with doxycycline 100 mg twice daily x7 days.  Vaginal cytology pending.  Covered with Diflucan for vaginitis symptoms.  Educated on avoiding sexual contact for minimum of 7 days to allow time for treatment to clear infection. Final Clinical Impressions(s) / UC Diagnoses   Final diagnoses:  STD exposure  Vaginal irritation     Discharge Instructions     You will be notified via My Chart of the results of your vaginal cytology. Results will be available within 24 hours. In the meantime refrain from any sexual intercourse until partners treatment is completed and your results are known. Start treatment for chlamydia with doxycycline 100 mg twice daily for 7 days and also prescribed you Diflucan 150 mg take 1 dose today and repeat in 3 days for vaginal irritation    ED Prescriptions    Medication Sig Dispense Auth. Provider   doxycycline (VIBRAMYCIN) 100 MG capsule Take 1 capsule (100 mg total) by mouth 2 (two) times daily for 7 days. 14 capsule Bing Neighbors, FNP   fluconazole (DIFLUCAN) 150 MG tablet Take 1 tablet (150 mg total) by mouth once for 1 dose. Repeat if needed 2 tablet Bing Neighbors, FNP     PDMP not reviewed this encounter.   Bing Neighbors, FNP 02/07/21 1902    Bing Neighbors, FNP 02/07/21 1902

## 2021-02-07 NOTE — Discharge Instructions (Addendum)
You will be notified via My Chart of the results of your vaginal cytology. Results will be available within 24 hours. In the meantime refrain from any sexual intercourse until partners treatment is completed and your results are known. Start treatment for chlamydia with doxycycline 100 mg twice daily for 7 days and also prescribed you Diflucan 150 mg take 1 dose today and repeat in 3 days for vaginal irritation

## 2021-02-08 ENCOUNTER — Ambulatory Visit (HOSPITAL_COMMUNITY): Payer: Self-pay

## 2021-02-08 ENCOUNTER — Telehealth (HOSPITAL_COMMUNITY): Payer: Self-pay | Admitting: Emergency Medicine

## 2021-02-08 LAB — CERVICOVAGINAL ANCILLARY ONLY
Bacterial Vaginitis (gardnerella): POSITIVE — AB
Candida Glabrata: NEGATIVE
Candida Vaginitis: NEGATIVE
Chlamydia: NEGATIVE
Comment: NEGATIVE
Comment: NEGATIVE
Comment: NEGATIVE
Comment: NEGATIVE
Comment: NEGATIVE
Comment: NORMAL
Neisseria Gonorrhea: NEGATIVE
Trichomonas: NEGATIVE

## 2021-02-08 MED ORDER — METRONIDAZOLE 500 MG PO TABS: 500.0000 mg | ORAL_TABLET | Freq: Two times a day (BID) | ORAL | 0 refills | Status: DC

## 2021-03-18 ENCOUNTER — Other Ambulatory Visit: Payer: Self-pay | Admitting: Nurse Practitioner

## 2021-03-18 ENCOUNTER — Other Ambulatory Visit: Payer: Self-pay | Admitting: Family Medicine

## 2021-03-19 ENCOUNTER — Encounter: Payer: Self-pay | Admitting: Nurse Practitioner

## 2021-03-22 ENCOUNTER — Ambulatory Visit (INDEPENDENT_AMBULATORY_CARE_PROVIDER_SITE_OTHER): Payer: Medicaid Other | Admitting: Nurse Practitioner

## 2021-03-22 ENCOUNTER — Encounter: Payer: Self-pay | Admitting: Nurse Practitioner

## 2021-03-22 ENCOUNTER — Other Ambulatory Visit: Payer: Self-pay

## 2021-03-22 VITALS — BP 100/60 | HR 69 | Temp 97.1°F | Ht 66.0 in | Wt 111.6 lb

## 2021-03-22 DIAGNOSIS — Z23 Encounter for immunization: Secondary | ICD-10-CM

## 2021-03-22 DIAGNOSIS — Z3041 Encounter for surveillance of contraceptive pills: Secondary | ICD-10-CM | POA: Diagnosis not present

## 2021-03-22 DIAGNOSIS — L739 Follicular disorder, unspecified: Secondary | ICD-10-CM

## 2021-03-22 MED ORDER — NORGESTIMATE-ETH ESTRADIOL 0.25-35 MG-MCG PO TABS
1.0000 | ORAL_TABLET | Freq: Every day | ORAL | 3 refills | Status: DC
Start: 2021-03-22 — End: 2021-09-02

## 2021-03-22 NOTE — Progress Notes (Signed)
   Subjective:  Patient ID: Melissa Farmer, female    DOB: 08/12/1996  Age: 25 y.o. MRN: [id number]  CC: Acute Visit (Pt c/o bump on left breast that is painful x 4 days.)  Rash This is a new problem. The current episode started in the past 7 days. The problem is unchanged. The affected locations include the chest. The rash is characterized by redness and pain. It is unknown if there was an exposure to a precipitant. Pertinent negatives include no fever, joint pain, shortness of breath or sore throat. Past treatments include nothing.   Reviewed past Medical, Social and Family history today.  Outpatient Medications Prior to Visit  Medication Sig Dispense Refill  . cetirizine (ZYRTEC) 10 MG tablet Take 1 tablet (10 mg total) by mouth daily. 30 tablet 0  . fluticasone (FLONASE) 50 MCG/ACT nasal spray Place 2 sprays into both nostrils daily. 16 g 1  . ibuprofen (ADVIL) 600 MG tablet Take 1 tablet (600 mg total) by mouth every 8 (eight) hours as needed (with food). 30 tablet 0  . norgestimate-ethinyl estradiol (ORTHO-CYCLEN) 0.25-35 MG-MCG tablet Take 1 tablet by mouth daily. 1 Package 11  . metroNIDAZOLE (FLAGYL) 500 MG tablet Take 1 tablet (500 mg total) by mouth 2 (two) times daily. (Patient not taking: Reported on 03/22/2021) 14 tablet 0   No facility-administered medications prior to visit.    ROS See HPI  Objective:  BP 100/60 (BP Location: Left Arm, Patient Position: Sitting, Cuff Size: Normal)   Pulse 69   Temp (!) 97.1 F (36.2 C) (Temporal)   Ht 5\' 6"  (1.676 m)   Wt 111 lb 9.6 oz (50.6 kg)   SpO2 100%   BMI 18.01 kg/m   Physical Exam Chest:    Skin:    Findings: Rash present.  Neurological:     Mental Status: She is alert and oriented to person, place, and time.    Assessment & Plan:  This visit occurred during the SARS-CoV-2 public health emergency.  Safety protocols were in place, including screening questions prior to the visit, additional usage of staff PPE,  and extensive cleaning of exam room while observing appropriate contact time as indicated for disinfecting solutions.   Melissa Farmer was seen today for acute visit.  Diagnoses and all orders for this visit:  Folliculitis  Encounter for surveillance of contraceptive pills -     norgestimate-ethinyl estradiol (ORTHO-CYCLEN) 0.25-35 MG-MCG tablet; Take 1 tablet by mouth daily.    Problem List Items Addressed This Visit   None   Visit Diagnoses    Folliculitis    -  Primary   Encounter for surveillance of contraceptive pills       Relevant Medications   norgestimate-ethinyl estradiol (ORTHO-CYCLEN) 0.25-35 MG-MCG tablet      Follow-up: Return in about 3 months (around 06/22/2021) for CPE (fasting).  08/22/2021, NP

## 2021-03-22 NOTE — Addendum Note (Signed)
Addended by: Elyn Peers on: 03/22/2021 04:43 PM   Modules accepted: Orders

## 2021-03-22 NOTE — Patient Instructions (Addendum)
Apply warm compress 2-3x/day: Place a towel between your skin and the heat source. Leave the heat on for 20-30 minutes.  Return to office in 4weeks for second gardasil dose.  Folliculitis  Folliculitis is inflammation of the hair follicles. Folliculitis most commonly occurs on the scalp, thighs, legs, back, and buttocks. However, it can occur anywhere on the body. What are the causes? This condition may be caused by:  A bacterial infection (common).  A fungal infection.  A viral infection.  Contact with certain chemicals, especially oils and tars.  Shaving or waxing.  Greasy ointments or creams applied to the skin. Long-lasting folliculitis and folliculitis that keeps coming back may be caused by bacteria. This bacteria can live anywhere on your skin and is often found in the nostrils. What increases the risk? You are more likely to develop this condition if you have:  A weakened immune system.  Diabetes.  Obesity. What are the signs or symptoms? Symptoms of this condition include:  Redness.  Soreness.  Swelling.  Itching.  Small white or yellow, pus-filled, itchy spots (pustules) that appear over a reddened area. If there is an infection that goes deep into the follicle, these may develop into a boil (furuncle).  A group of closely packed boils (carbuncle). These tend to form in hairy, sweaty areas of the body. How is this diagnosed? This condition is diagnosed with a skin exam. To find what is causing the condition, your health care provider may take a sample of one of the pustules or boils for testing in a lab. How is this treated? This condition may be treated by:  Applying warm compresses to the affected areas.  Taking an antibiotic medicine or applying an antibiotic medicine to the skin.  Applying or bathing with an antiseptic solution.  Taking an over-the-counter medicine to help with itching.  Having a procedure to drain any pustules or boils. This may  be done if a pustule or boil contains a lot of pus or fluid.  Having laser hair removal. This may be done to treat long-lasting folliculitis. Follow these instructions at home: Managing pain and swelling  If directed, apply heat to the affected area as often as told by your health care provider. Use the heat source that your health care provider recommends, such as a moist heat pack or a heating pad. ? Place a towel between your skin and the heat source. ? Leave the heat on for 20-30 minutes. ? Remove the heat if your skin turns bright red. This is especially important if you are unable to feel pain, heat, or cold. You may have a greater risk of getting burned.   General instructions  If you were prescribed an antibiotic medicine, take it or apply it as told by your health care provider. Do not stop using the antibiotic even if your condition improves.  Check the irritated area every day for signs of infection. Check for: ? Redness, swelling, or pain. ? Fluid or blood. ? Warmth. ? Pus or a bad smell.  Do not shave irritated skin.  Take over-the-counter and prescription medicines only as told by your health care provider.  Keep all follow-up visits as told by your health care provider. This is important. Get help right away if:  You have more redness, swelling, or pain in the affected area.  Red streaks are spreading from the affected area.  You have a fever. Summary  Folliculitis is inflammation of the hair follicles. Folliculitis most commonly occurs on  the scalp, thighs, legs, back, and buttocks.  This condition may be treated by taking an antibiotic medicine or applying an antibiotic medicine to the skin, and applying or bathing with an antiseptic solution.  If you were prescribed an antibiotic medicine, take it or apply it as told by your health care provider. Do not stop using the antibiotic even if your condition improves.  Get help right away if you have new or  worsening symptoms.  Keep all follow-up visits as told by your health care provider. This is important. This information is not intended to replace advice given to you by your health care provider. Make sure you discuss any questions you have with your health care provider. Document Revised: 06/12/2018 Document Reviewed: 06/12/2018 Elsevier Patient Education  2021 [employer].

## 2021-03-29 ENCOUNTER — Other Ambulatory Visit: Payer: Self-pay

## 2021-03-29 ENCOUNTER — Encounter (HOSPITAL_COMMUNITY): Payer: Self-pay

## 2021-03-29 ENCOUNTER — Ambulatory Visit (HOSPITAL_COMMUNITY)
Admission: EM | Admit: 2021-03-29 | Discharge: 2021-03-29 | Disposition: A | Discharge status: Home / Self Care | Payer: Medicaid Other | Attending: Medical Oncology | Admitting: Medical Oncology

## 2021-03-29 DIAGNOSIS — L500 Allergic urticaria: Secondary | ICD-10-CM

## 2021-03-29 DIAGNOSIS — L299 Pruritus, unspecified: Secondary | ICD-10-CM | POA: Diagnosis present

## 2021-03-29 DIAGNOSIS — N898 Other specified noninflammatory disorders of vagina: Secondary | ICD-10-CM | POA: Diagnosis not present

## 2021-03-29 MED ORDER — PREDNISONE 10 MG (21) PO TBPK: ORAL_TABLET | Freq: Every day | ORAL | 0 refills | Status: DC

## 2021-03-29 NOTE — ED Triage Notes (Signed)
Pt in with c/o hives on her face that she noticed after eating expired applesauce. Pt states that her face is very itchy although she has been taking benadryl and zyrtec  Denies use of any new products   Pt also c/o vaginal itching and yellow discharge

## 2021-03-29 NOTE — ED Provider Notes (Addendum)
[facility]    CSN: [id number] Arrival date & time: 03/29/21  1742      History   Chief Complaint Chief Complaint  Patient presents with  . Urticaria  . Vaginal Itching    HPI Melissa Farmer is a 25 y.o. female.   HPI   Urticaria: Patient has noticed urticaria of her face ever since eating expired apple sauce today.  She has tried Benadryl and Zyrtec which has resolved the rash but still is having some itchiness(she brings in pictures and videos of the urticaria of her face).  She denies any swelling of tongue or lips.  She is not having any trouble breathing or swallowing and no nausea or vomiting or diarrhea. No new medications or products. A few days before symptoms started she had a GI bug that has fully resolved. She does mention that she has been having some vaginal itching and yellow discharge for the past few days and would like work-up for this as well while she is here. She denies pelvic pain, urinary symptoms or known exposures to any STI. [facility]: ended 03/26/2021  Past Medical History:  Diagnosis Date  . Medical history non-contributory     Patient Active Problem List   Diagnosis Date Noted  . Iron deficiency anemia due to chronic blood loss 06/26/2020  . Vitamin D insufficiency 06/26/2020  . Closed nondisplaced fracture of styloid process of left ulna 06/20/2020  . Anxiety 05/23/2020  . Left wrist tendonitis 05/22/2020  . Otitis media 09/09/2019  . Headache 03/26/2016    Past Surgical History:  Procedure Laterality Date  . NO PAST SURGERIES      OB History    Gravida  1   Para  1   Term  1   Preterm      AB      Living  1     SAB      IAB      Ectopic      Multiple  0   Live Births  1            Home Medications    Prior to Admission medications   Medication Sig Start Date End Date Taking? Authorizing Provider  cetirizine (ZYRTEC) 10 MG tablet Take 1 tablet (10 mg total) by mouth daily. 12/21/20   Nche, Bonna Gains, NP  fluticasone (FLONASE) 50 MCG/ACT nasal spray Place 2 sprays into both nostrils daily. 12/21/20   Nche, Bonna Gains, NP  ibuprofen (ADVIL) 600 MG tablet Take 1 tablet (600 mg total) by mouth every 8 (eight) hours as needed (with food). 06/20/20   Nche, Bonna Gains, NP  norgestimate-ethinyl estradiol (ORTHO-CYCLEN) 0.25-35 MG-MCG tablet Take 1 tablet by mouth daily. 03/22/21   Nche, Bonna Gains, NP    Family History Family History  Problem Relation Age of Onset  . Kidney disease Maternal Grandmother   . Stroke Maternal Grandfather   . Diabetes Maternal Grandfather   . Heart disease Maternal Grandfather   . Cancer Paternal Grandmother   . Asthma Paternal Grandmother   . Asthma Father     Social History Social History   Tobacco Use  . Smoking status: Never Smoker  . Smokeless tobacco: Never Used  Vaping Use  . Vaping Use: Never used  Substance Use Topics  . Alcohol use: No    Alcohol/week: 0.0 standard drinks  . Drug use: No     Allergies   Patient has no known allergies.   Review of Systems  Review of Systems  As stated above in HPI Physical Exam Triage Vital Signs ED Triage Vitals  Enc Vitals Group     BP 03/29/21 1807 131/74     Pulse Rate 03/29/21 1807 93     Resp --      Temp 03/29/21 1807 99.1 F (37.3 C)     Temp src --      SpO2 03/29/21 1807 99 %     Weight --      Height --      Head Circumference --      Peak Flow --      Pain Score 03/29/21 1803 6     Pain Loc --      Pain Edu? --      Excl. in GC? --    No data found.  Updated Vital Signs BP 131/74   Pulse 93   Temp 99.1 F (37.3 C)   LMP 03/26/2021   SpO2 99%   Physical Exam Vitals and nursing note reviewed.  Constitutional:      General: She is not in acute distress.    Appearance: Normal appearance. She is not ill-appearing, toxic-appearing or diaphoretic.  HENT:     Head: Normocephalic and atraumatic.     Nose: Nose normal.     Mouth/Throat:     Mouth: Mucous  membranes are moist.     Pharynx: Oropharynx is clear. No oropharyngeal exudate or posterior oropharyngeal erythema.  Eyes:     Extraocular Movements: Extraocular movements intact.     Pupils: Pupils are equal, round, and reactive to light.  Cardiovascular:     Rate and Rhythm: Normal rate and regular rhythm.     Heart sounds: Normal heart sounds.  Pulmonary:     Effort: Pulmonary effort is normal.     Breath sounds: Normal breath sounds.  Abdominal:     General: Bowel sounds are normal. There is no distension.     Palpations: Abdomen is soft.     Tenderness: There is no abdominal tenderness. There is no guarding.  Genitourinary:    Comments: Pt performs self swab collection before visit Musculoskeletal:     Cervical back: Normal range of motion and neck supple.  Lymphadenopathy:     Cervical: No cervical adenopathy.  Skin:    General: Skin is warm.     Findings: No rash.  Neurological:     Mental Status: She is alert and oriented to person, place, and time.      UC Treatments / Results  Labs (all labs ordered are listed, but only abnormal results are displayed) Labs Reviewed - No data to display  EKG   Radiology No results found.  Procedures Procedures (including critical care time)  Medications Ordered in UC Medications - No data to display  Initial Impression / Assessment and Plan / UC Course  I have reviewed the triage vital signs and the nursing notes.  Pertinent labs & imaging results that were available during my care of the patient were reviewed by me and considered in my medical decision making (see chart for details).     New. Treating with prednisone taper to help with urticaria and itching and to prevent further systemic complications. Discussed red flag signs and symptoms. May be an inflammatory response from her immune system fighting off the GI bug. As the GI symptoms started days before urticaria this is not likely anaphylaxis but we discussed red  flag signs and symptoms. Vaginal swab pending and will  treat accordingly.  Final Clinical Impressions(s) / UC Diagnoses   Final diagnoses:  None   Discharge Instructions   None    ED Prescriptions    None     PDMP not reviewed this encounter.   Rushie Chestnut, Cordelia Poche 03/29/21 1841    Rushie Chestnut, PA-C 03/29/21 1843    Rushie Chestnut, PA-C 03/29/21 2120

## 2021-04-01 ENCOUNTER — Telehealth (HOSPITAL_COMMUNITY): Payer: Self-pay | Admitting: Emergency Medicine

## 2021-04-01 LAB — CERVICOVAGINAL ANCILLARY ONLY
Bacterial Vaginitis (gardnerella): NEGATIVE
Candida Glabrata: NEGATIVE
Candida Vaginitis: POSITIVE — AB
Chlamydia: NEGATIVE
Comment: NEGATIVE
Comment: NEGATIVE
Comment: NEGATIVE
Comment: NEGATIVE
Comment: NEGATIVE
Comment: NORMAL
Neisseria Gonorrhea: NEGATIVE
Trichomonas: NEGATIVE

## 2021-04-01 MED ORDER — FLUCONAZOLE 150 MG PO TABS: 150.0000 mg | ORAL_TABLET | Freq: Once | ORAL | 0 refills | Status: AC

## 2021-04-23 ENCOUNTER — Ambulatory Visit: Payer: Medicaid Other

## 2021-06-10 ENCOUNTER — Ambulatory Visit (INDEPENDENT_AMBULATORY_CARE_PROVIDER_SITE_OTHER): Payer: Medicaid Other

## 2021-06-10 ENCOUNTER — Encounter (HOSPITAL_COMMUNITY): Payer: Self-pay

## 2021-06-10 ENCOUNTER — Ambulatory Visit (HOSPITAL_COMMUNITY)
Admission: EM | Admit: 2021-06-10 | Discharge: 2021-06-10 | Disposition: A | Discharge status: Home / Self Care | Payer: Medicaid Other | Attending: Internal Medicine | Admitting: Internal Medicine

## 2021-06-10 ENCOUNTER — Other Ambulatory Visit: Payer: Self-pay

## 2021-06-10 DIAGNOSIS — S39012A Strain of muscle, fascia and tendon of lower back, initial encounter: Secondary | ICD-10-CM | POA: Diagnosis not present

## 2021-06-10 DIAGNOSIS — M546 Pain in thoracic spine: Secondary | ICD-10-CM | POA: Diagnosis not present

## 2021-06-10 DIAGNOSIS — R109 Unspecified abdominal pain: Secondary | ICD-10-CM

## 2021-06-10 DIAGNOSIS — S62664A Nondisplaced fracture of distal phalanx of right ring finger, initial encounter for closed fracture: Secondary | ICD-10-CM | POA: Diagnosis not present

## 2021-06-10 DIAGNOSIS — N3000 Acute cystitis without hematuria: Secondary | ICD-10-CM | POA: Diagnosis not present

## 2021-06-10 DIAGNOSIS — Z113 Encounter for screening for infections with a predominantly sexual mode of transmission: Secondary | ICD-10-CM | POA: Insufficient documentation

## 2021-06-10 LAB — POCT URINALYSIS DIPSTICK, ED / UC
Bilirubin Urine: NEGATIVE
Glucose, UA: NEGATIVE mg/dL
Hgb urine dipstick: NEGATIVE
Ketones, ur: NEGATIVE mg/dL
Nitrite: NEGATIVE
Protein, ur: NEGATIVE mg/dL
Specific Gravity, Urine: 1.025 (ref 1.005–1.030)
Urobilinogen, UA: 1 mg/dL (ref 0.0–1.0)
pH: 7 (ref 5.0–8.0)

## 2021-06-10 MED ORDER — NITROFURANTOIN MONOHYD MACRO 100 MG PO CAPS: 100.0000 mg | ORAL_CAPSULE | Freq: Two times a day (BID) | ORAL | 0 refills | Status: DC

## 2021-06-10 MED ORDER — IBUPROFEN 600 MG PO TABS: 600.0000 mg | ORAL_TABLET | Freq: Four times a day (QID) | ORAL | 0 refills | Status: DC | PRN

## 2021-06-10 NOTE — Discharge Instructions (Addendum)
You have a fracture of your right ring finger.  A splint has been applied.  Please wear this until evaluated by orthopedist.  Please call provided contact information for orthopedist in 1 to 3 days for further evaluation and management.  Your rib x-ray was negative for fracture.  Suspect muscle strain and/or bruising that is causing your pain.  Your urine shows signs of urinary tract infection.  You have been prescribed Macrobid antibiotic to take as directed.  Your STD swab and urine culture are pending.  We will call if these are positive.  Please apply ice application to affected finger.  May alternate ice and heat application to area of pain of back.  You have also been prescribed ibuprofen to take as needed for pain.  Please not take any additional ibuprofen, Advil, Aleve while taking this ibuprofen.

## 2021-06-10 NOTE — ED Triage Notes (Signed)
Pt in with c/o right ring finger jammed x 3 days  Pt also c/o back pain x 3 days   Pt states she has an odor and would like std testing
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
## 2021-06-11 ENCOUNTER — Other Ambulatory Visit (HOSPITAL_COMMUNITY): Payer: Self-pay | Admitting: Internal Medicine

## 2021-06-11 LAB — URINE CULTURE: Culture: 10000 — AB

## 2021-06-11 LAB — CERVICOVAGINAL ANCILLARY ONLY
Bacterial Vaginitis (gardnerella): POSITIVE — AB
Candida Glabrata: NEGATIVE
Candida Vaginitis: NEGATIVE
Chlamydia: NEGATIVE
Comment: NEGATIVE
Comment: NEGATIVE
Comment: NEGATIVE
Comment: NEGATIVE
Comment: NEGATIVE
Comment: NORMAL
Neisseria Gonorrhea: NEGATIVE
Trichomonas: NEGATIVE

## 2021-06-11 MED ORDER — AMOXICILLIN 875 MG PO TABS: 875.0000 mg | ORAL_TABLET | Freq: Two times a day (BID) | ORAL | 0 refills | Status: AC

## 2021-06-11 MED ORDER — METRONIDAZOLE 500 MG PO TABS: 500.0000 mg | ORAL_TABLET | Freq: Two times a day (BID) | ORAL | 0 refills | Status: AC

## 2021-06-11 NOTE — Progress Notes (Signed)
Patient was called and notified of results. Patient was advised to stop taking Macrobid antibiotic and to start amoxicillin and metronidazole. Patient voiced understanding. All questions were answered.

## 2021-06-12 ENCOUNTER — Encounter: Payer: Self-pay | Admitting: Family Medicine

## 2021-06-12 ENCOUNTER — Other Ambulatory Visit: Payer: Self-pay

## 2021-06-12 ENCOUNTER — Ambulatory Visit (INDEPENDENT_AMBULATORY_CARE_PROVIDER_SITE_OTHER): Payer: Medicaid Other | Admitting: Family Medicine

## 2021-06-12 DIAGNOSIS — S62654A Nondisplaced fracture of medial phalanx of right ring finger, initial encounter for closed fracture: Secondary | ICD-10-CM | POA: Diagnosis not present
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
## 2021-06-19 ENCOUNTER — Encounter: Payer: Self-pay | Admitting: Family Medicine

## 2021-06-19 ENCOUNTER — Ambulatory Visit (INDEPENDENT_AMBULATORY_CARE_PROVIDER_SITE_OTHER): Payer: Medicaid Other

## 2021-06-19 ENCOUNTER — Ambulatory Visit: Payer: Self-pay

## 2021-06-19 ENCOUNTER — Other Ambulatory Visit: Payer: Self-pay

## 2021-06-19 ENCOUNTER — Ambulatory Visit (INDEPENDENT_AMBULATORY_CARE_PROVIDER_SITE_OTHER): Payer: Medicaid Other | Admitting: Family Medicine

## 2021-06-19 DIAGNOSIS — S62654A Nondisplaced fracture of medial phalanx of right ring finger, initial encounter for closed fracture: Secondary | ICD-10-CM

## 2021-06-19 NOTE — Progress Notes (Signed)
   Office Visit Note   Patient: Melissa Farmer           Date of Birth: 07-Jul-1996           MRN: [id number] Visit Date: 06/19/2021 Requested by: Anne Ng, NP [address] [place],  [place] [address] PCP: Anne Ng, NP  Subjective: Chief Complaint  Patient presents with   Right Ring Finger - Follow-up, Fracture    12 days post injury. Still wearing splint -- takes it off some while at home. She says there is a lump of fluid on her finger - painful to touch.     HPI: She is about 12 days status post right fourth finger middle phalanx fracture intra-articular into the DIP joint.  Doing well, wearing her splint most of the time.  Pain is improving.              ROS:   All other systems were reviewed and are negative.  Objective: Vital Signs: LMP 06/06/2021 (Approximate)   Physical Exam:  General:  Alert and oriented, in no acute distress. Pulm:  Breathing unlabored. Psy:  Normal mood, congruent affect. Skin: There is no skin breakdown.  Slight erythema of the skin over the proximal phalanx dorsally. Right fourth finger: There is minimal swelling near the DIP joint.  No significant bruising.  No visible deformity.  Did not evaluate tendon function today.    Imaging: XR Finger Ring Right  Result Date: 06/19/2021 X-rays of the right fourth finger reveal anatomic alignment of the middle phalanx intra-articular fracture into the DIP joint.  Joint surface is congruent.  Possibly some early callus formation seen on the lateral view.   Assessment & Plan: Stable 12 days status post right fourth finger middle phalanx intra-articular fracture into the DIP joint -New splint given to continue immobilizing the DIP joint.  Cautious activities, return in 10 to 14 days for two-view fourth finger x-ray.  If stable and healing, then we will start working on range of motion.     Procedures: No procedures performed        PMFS History: Patient Active Problem  List   Diagnosis Date Noted   Iron deficiency anemia due to chronic blood loss 06/26/2020   Vitamin D insufficiency 06/26/2020   Closed nondisplaced fracture of styloid process of left ulna 06/20/2020   Anxiety 05/23/2020   Left wrist tendonitis 05/22/2020   Otitis media 09/09/2019   Headache 03/26/2016   Past Medical History:  Diagnosis Date   Medical history non-contributory     Family History  Problem Relation Age of Onset   Kidney disease Maternal Grandmother    Stroke Maternal Grandfather    Diabetes Maternal Grandfather    Heart disease Maternal Grandfather    Cancer Paternal Grandmother    Asthma Paternal Grandmother    Asthma Father     Past Surgical History:  Procedure Laterality Date   NO PAST SURGERIES     Social History   Occupational History   Not on file  Tobacco Use   Smoking status: Never   Smokeless tobacco: Never  Vaping Use   Vaping Use: Never used  Substance and Sexual Activity   Alcohol use: No    Alcohol/week: 0.0 standard drinks   Drug use: No   Sexual activity: Yes    Birth control/protection: None

## 2021-06-28 ENCOUNTER — Ambulatory Visit (INDEPENDENT_AMBULATORY_CARE_PROVIDER_SITE_OTHER): Payer: Medicaid Other | Admitting: Nurse Practitioner

## 2021-06-28 ENCOUNTER — Encounter: Payer: Self-pay | Admitting: Nurse Practitioner

## 2021-06-28 ENCOUNTER — Other Ambulatory Visit: Payer: Self-pay

## 2021-06-28 VITALS — BP 110/70 | HR 78 | Temp 97.8°F | Ht 66.75 in | Wt 113.6 lb

## 2021-06-28 DIAGNOSIS — Z0000 Encounter for general adult medical examination without abnormal findings: Secondary | ICD-10-CM

## 2021-06-28 DIAGNOSIS — Z23 Encounter for immunization: Secondary | ICD-10-CM | POA: Diagnosis not present

## 2021-06-28 NOTE — Progress Notes (Signed)
Subjective:    Patient ID: Melissa Farmer, female    DOB: 1996-05-23, 25 y.o.   MRN: [id number]  Patient presents today for CPE   HPI No acute complaints Agreed to 2nd HPV vaccine.  Vision:up to date Dental:up to date Diet:regular Exercise:none Weight:  Wt Readings from Last 3 Encounters:  06/28/21 113 lb 9.6 oz (51.5 kg)  03/22/21 111 lb 9.6 oz (50.6 kg)  12/21/20 113 lb 3.2 oz (51.3 kg)    Sexual History (orientation,birth control, marital status, STD):use of COC, denies need for STD screen, pelvic and breast exam  Depression/Suicide: Depression screen [facility] 2/9 06/28/2021 06/11/2020 05/22/2020 05/22/2020 09/09/2019 09/21/2018 09/14/2018  Decreased Interest 0 0 0 0 0 0 0  Down, Depressed, Hopeless 0 0 2 0 0 0 0  PHQ - 2 Score 0 0 2 0 0 0 0  Altered sleeping 0 - 0 - - 3 2  Tired, decreased energy 2 - 3 - - 0 0  Change in appetite 0 - 2 - - 0 0  Feeling bad or failure about yourself  0 - 0 - - 0 0  Trouble concentrating 0 - 0 - - 0 0  Moving slowly or fidgety/restless 0 - 0 - - 0 0  Suicidal thoughts 0 - 0 - - 0 0  PHQ-9 Score 2 - 7 - - 3 2  Difficult doing work/chores Somewhat difficult - - - - - -  Some recent data might be hidden   Immunizations: (TDAP, Hep C screen, Pneumovax, Influenza, zoster)  Health Maintenance  Topic Date Due   COVID-19 Vaccine (3 - Booster for Pfizer series) 07/19/2020   Flu Shot  06/17/2021   HPV Vaccine (3 - 3-dose series) 10/28/2021   Chlamydia screening  06/10/2022   Pap Smear  12/25/2022   Pap Smear  12/25/2022   Tetanus Vaccine  06/28/2028   Hepatitis C Screening: USPSTF Recommendation to screen - Ages 18-79 yo.  Completed   HIV Screening  Completed   Pneumococcal Vaccination  Aged Out   Fall Risk: Fall Risk  03/22/2021 05/22/2020 09/09/2019  Falls in the past year? 0 0 0  Number falls in past yr: 0 0 -  Injury with Fall? 0 0 -  Risk for fall due to : No Fall Risks - -  Follow up Falls evaluation completed - -   Medications and  allergies reviewed with patient and updated if appropriate.  Patient Active Problem List   Diagnosis Date Noted   Iron deficiency anemia due to chronic blood loss 06/26/2020   Vitamin D insufficiency 06/26/2020   Closed nondisplaced fracture of styloid process of left ulna 06/20/2020   Anxiety 05/23/2020   Left wrist tendonitis 05/22/2020   Otitis media 09/09/2019   Headache 03/26/2016    Current Outpatient Medications on File Prior to Visit  Medication Sig Dispense Refill   ibuprofen (ADVIL) 600 MG tablet Take 1 tablet (600 mg total) by mouth every 6 (six) hours as needed for mild pain or moderate pain. 30 tablet 0   norgestimate-ethinyl estradiol (ORTHO-CYCLEN) 0.25-35 MG-MCG tablet Take 1 tablet by mouth daily. 90 tablet 3   No current facility-administered medications on file prior to visit.    Past Medical History:  Diagnosis Date   Medical history non-contributory     Past Surgical History:  Procedure Laterality Date   NO PAST SURGERIES      Social History   Socioeconomic History   Marital status: Single    Spouse  name: Not on file   Number of children: Not on file   Years of education: Not on file   Highest education level: Not on file  Occupational History   Not on file  Tobacco Use   Smoking status: Never   Smokeless tobacco: Never  Vaping Use   Vaping Use: Never used  Substance and Sexual Activity   Alcohol use: No    Alcohol/week: 0.0 standard drinks   Drug use: No   Sexual activity: Yes    Birth control/protection: None  Other Topics Concern   Not on file  Social History Narrative   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   Social Determinants of Health   Financial Resource Strain: Not on file  Food Insecurity: Not on file  Transportation Needs: Not on file  Physical Activity: Not on file  Stress: Not on file  Social Connections: Not on file    Family History  Problem Relation Age of Onset   Kidney disease Maternal  Grandmother    Stroke Maternal Grandfather    Diabetes Maternal Grandfather    Heart disease Maternal Grandfather    Cancer Paternal Grandmother    Asthma Paternal Grandmother    Asthma Father         Review of Systems  Constitutional:  Negative for fever, malaise/fatigue and weight loss.  HENT:  Negative for congestion and sore throat.   Eyes:        Negative for visual changes  Respiratory:  Negative for cough and shortness of breath.   Cardiovascular:  Negative for chest pain, palpitations and leg swelling.  Gastrointestinal:  Negative for blood in stool, constipation, diarrhea and heartburn.  Genitourinary:  Negative for dysuria, frequency and urgency.  Musculoskeletal:  Negative for falls, joint pain and myalgias.  Skin:  Negative for rash.  Neurological:  Negative for dizziness, sensory change and headaches.  Endo/Heme/Allergies:  Does not bruise/bleed easily.  Psychiatric/Behavioral:  Negative for depression, substance abuse and suicidal ideas. The patient is not nervous/anxious.    Objective:   Vitals:   06/28/21 0938  BP: 110/70  Pulse: 78  Temp: 97.8 F (36.6 C)  SpO2: 100%    Body mass index is 17.93 kg/m.   Physical Examination:  Physical Exam Vitals reviewed.  Constitutional:      General: She is not in acute distress. HENT:     Right Ear: Tympanic membrane, ear canal and external ear normal.     Left Ear: Tympanic membrane, ear canal and external ear normal.  Eyes:     General: No scleral icterus.    Extraocular Movements: Extraocular movements intact.     Conjunctiva/sclera: Conjunctivae normal.  Cardiovascular:     Rate and Rhythm: Normal rate and regular rhythm.     Heart sounds: Normal heart sounds.  Pulmonary:     Effort: Pulmonary effort is normal. No respiratory distress.     Breath sounds: Normal breath sounds.  Abdominal:     General: Bowel sounds are normal. There is no distension.     Palpations: Abdomen is soft.   Genitourinary:    Comments: declined Musculoskeletal:        General: Normal range of motion.     Cervical back: Normal range of motion and neck supple.  Lymphadenopathy:     Cervical: No cervical adenopathy.  Skin:    General: Skin is warm and dry.  Neurological:     Mental Status: She is alert and oriented to person, place,  and time.  Psychiatric:        Mood and Affect: Mood normal.        Behavior: Behavior normal.    ASSESSMENT and PLAN: This visit occurred during the SARS-CoV-2 public health emergency.  Safety protocols were in place, including screening questions prior to the visit, additional usage of staff PPE, and extensive cleaning of exam room while observing appropriate contact time as indicated for disinfecting solutions.   Melissa Farmer was seen today for annual exam.  Diagnoses and all orders for this visit:  Preventative health care -     HPV 9-valent vaccine,Recombinat -     CBC -     Comprehensive metabolic panel -     TSH  Need for HPV vaccine -     HPV 9-valent vaccine,Recombinat     Problem List Items Addressed This Visit   None Visit Diagnoses     Preventative health care    -  Primary   Relevant Orders   HPV 9-valent vaccine,Recombinat (Completed)   CBC   Comprehensive metabolic panel   TSH   Need for HPV vaccine       Relevant Orders   HPV 9-valent vaccine,Recombinat (Completed)       Follow up: Return in about 1 year (around 06/28/2022) for CPE (fasting).  Melissa Penna, NP

## 2021-06-28 NOTE — Patient Instructions (Signed)
Preventive Care 21-23 Years Old, Female Preventive care refers to lifestyle choices and visits with your health care provider that can promote health and wellness. This includes: A yearly physical exam. This is also called an annual wellness visit. Regular dental and eye exams. Immunizations. Screening for certain conditions. Healthy lifestyle choices, such as: Eating a healthy diet. Getting regular exercise. Not using drugs or products that contain nicotine and tobacco. Limiting alcohol use. What can I expect for my preventive care visit? Physical exam Your health care provider may check your: Height and weight. These may be used to calculate your BMI (body mass index). BMI is a measurement that tells if you are at a healthy weight. Heart rate and blood pressure. Body temperature. Skin for abnormal spots. Counseling Your health care provider may ask you questions about your: Past medical problems. Family's medical history. Alcohol, tobacco, and drug use. Emotional well-being. Home life and relationship well-being. Sexual activity. Diet, exercise, and sleep habits. Work and work environment. Access to firearms. Method of birth control. Menstrual cycle. Pregnancy history. What immunizations do I need?  Vaccines are usually given at various ages, according to a schedule. Your health care provider will recommend vaccines for you based on your age, medicalhistory, and lifestyle or other factors, such as travel or where you work. What tests do I need?  Blood tests Lipid and cholesterol levels. These may be checked every 5 years starting at age 20. Hepatitis C test. Hepatitis B test. Screening Diabetes screening. This is done by checking your blood sugar (glucose) after you have not eaten for a while (fasting). STD (sexually transmitted disease) testing, if you are at risk. BRCA-related cancer screening. This may be done if you have a family history of breast, ovarian, tubal, or  peritoneal cancers. Pelvic exam and Pap test. This may be done every 3 years starting at age 21. Starting at age 30, this may be done every 5 years if you have a Pap test in combination with an HPV test. Talk with your health care provider about your test results, treatment options,and if necessary, the need for more tests. Follow these instructions at home: Eating and drinking  Eat a healthy diet that includes fresh fruits and vegetables, whole grains, lean protein, and low-fat dairy products. Take vitamin and mineral supplements as recommended by your health care provider. Do not drink alcohol if: Your health care provider tells you not to drink. You are pregnant, may be pregnant, or are planning to become pregnant. If you drink alcohol: Limit how much you have to 0-1 drink a day. Be aware of how much alcohol is in your drink. In the [place] one drink equals one 12 oz bottle of beer (355 mL), one 5 oz glass of wine (148 mL), or one 1 oz glass of hard liquor (44 mL).  Lifestyle Take daily care of your teeth and gums. Brush your teeth every morning and night with fluoride toothpaste. Floss one time each day. Stay active. Exercise for at least 30 minutes 5 or more days each week. Do not use any products that contain nicotine or tobacco, such as cigarettes, e-cigarettes, and chewing tobacco. If you need help quitting, ask your health care provider. Do not use drugs. If you are sexually active, practice safe sex. Use a condom or other form of protection to prevent STIs (sexually transmitted infections). If you do not wish to become pregnant, use a form of birth control. If you plan to become pregnant, see your health care   provider for a prepregnancy visit. Find healthy ways to cope with stress, such as: Meditation, yoga, or listening to music. Journaling. Talking to a trusted person. Spending time with friends and family. Safety Always wear your seat belt while driving or riding in a  vehicle. Do not drive: If you have been drinking alcohol. Do not ride with someone who has been drinking. When you are tired or distracted. While texting. Wear a helmet and other protective equipment during sports activities. If you have firearms in your house, make sure you follow all gun safety procedures. Seek help if you have been physically or sexually abused. What's next? Go to your health care provider once a year for an annual wellness visit. Ask your health care provider how often you should have your eyes and teeth checked. Stay up to date on all vaccines. This information is not intended to replace advice given to you by your health care provider. Make sure you discuss any questions you have with your healthcare provider. Document Revised: 07/01/2020 Document Reviewed: 07/15/2018 Elsevier Patient Education  2022 [employer].

## 2021-07-01 ENCOUNTER — Other Ambulatory Visit: Payer: Self-pay

## 2021-07-01 ENCOUNTER — Ambulatory Visit (INDEPENDENT_AMBULATORY_CARE_PROVIDER_SITE_OTHER): Payer: Medicaid Other

## 2021-07-01 ENCOUNTER — Ambulatory Visit (INDEPENDENT_AMBULATORY_CARE_PROVIDER_SITE_OTHER): Payer: Medicaid Other | Admitting: Family Medicine

## 2021-07-01 DIAGNOSIS — S62654A Nondisplaced fracture of medial phalanx of right ring finger, initial encounter for closed fracture: Secondary | ICD-10-CM

## 2021-07-01 NOTE — Progress Notes (Signed)
   Office Visit Note   Patient: Melissa Farmer           Date of Birth: Feb 18, 1996           MRN: [id number] Visit Date: 07/01/2021 Requested by: Anne Ng, NP [address] [place],  [place] [address] PCP: Anne Ng, NP  Subjective: Chief Complaint  Patient presents with   Right Ring Finger - Fracture, Follow-up    Wearing the splint except for when she cleans/cooks/bathes. The "lump" on the finger is darkening in color. The pain is subsiding.     HPI: She is almost 4 weeks status post right fourth finger contusion with middle phalanx fracture intra-articular into the DIP joint.  Overall pain is improving.              ROS:   All other systems were reviewed and are negative.  Objective: Vital Signs: LMP 06/06/2021 (Approximate)   Physical Exam:  General:  Alert and oriented, in no acute distress. Pulm:  Breathing unlabored. Psy:  Normal mood, congruent affect. Skin: No skin breakdown Right fourth finger: She has expected stiffness at the DIP joint with flexion.  Full extension.  There is still tenderness at the distal aspect of the middle phalanx but much less than before.    Imaging: XR Finger Ring Right  Result Date: 07/01/2021 X-rays right fourth finger reveal anatomic alignment of the middle phalanx fracture.  I believe she has some early callus formation present.   Assessment & Plan: Clinically healing right fourth finger middle phalanx fracture -She will start removing the splint multiple times daily to work on range of motion to tolerance.  If in 2 to 3 weeks she is still having troubles, she will come back in for 1 more x-ray and possibly referral to hand therapy.  If she is pain-free and has good range of motion, she will follow-up as needed.     Procedures: No procedures performed        PMFS History: Patient Active Problem List   Diagnosis Date Noted   Iron deficiency anemia due to chronic blood loss 06/26/2020   Vitamin  D insufficiency 06/26/2020   Closed nondisplaced fracture of styloid process of left ulna 06/20/2020   Anxiety 05/23/2020   Left wrist tendonitis 05/22/2020   Otitis media 09/09/2019   Headache 03/26/2016   Past Medical History:  Diagnosis Date   Medical history non-contributory     Family History  Problem Relation Age of Onset   Kidney disease Maternal Grandmother    Stroke Maternal Grandfather    Diabetes Maternal Grandfather    Heart disease Maternal Grandfather    Cancer Paternal Grandmother    Asthma Paternal Grandmother    Asthma Father     Past Surgical History:  Procedure Laterality Date   NO PAST SURGERIES     Social History   Occupational History   Not on file  Tobacco Use   Smoking status: Never   Smokeless tobacco: Never  Vaping Use   Vaping Use: Never used  Substance and Sexual Activity   Alcohol use: No    Alcohol/week: 0.0 standard drinks   Drug use: No   Sexual activity: Yes    Birth control/protection: None

## 2021-08-26 ENCOUNTER — Encounter (HOSPITAL_COMMUNITY): Payer: Self-pay | Admitting: *Deleted

## 2021-08-26 ENCOUNTER — Ambulatory Visit (HOSPITAL_COMMUNITY)
Admission: EM | Admit: 2021-08-26 | Discharge: 2021-08-26 | Disposition: A | Discharge status: Home / Self Care | Payer: Medicaid Other | Attending: Internal Medicine | Admitting: Internal Medicine

## 2021-08-26 ENCOUNTER — Ambulatory Visit (INDEPENDENT_AMBULATORY_CARE_PROVIDER_SITE_OTHER): Payer: Medicaid Other

## 2021-08-26 ENCOUNTER — Other Ambulatory Visit: Payer: Self-pay

## 2021-08-26 DIAGNOSIS — M79675 Pain in left toe(s): Secondary | ICD-10-CM | POA: Diagnosis not present

## 2021-08-26 DIAGNOSIS — S99922A Unspecified injury of left foot, initial encounter: Secondary | ICD-10-CM | POA: Diagnosis not present

## 2021-08-26 NOTE — ED Triage Notes (Signed)
Injury to 4th toe yesterday.

## 2021-08-26 NOTE — Discharge Instructions (Signed)
You can take Tylenol and/or Ibuprofen as needed for pain relief and fever reduction.   You can try buddy tape (tape your toe to the toe next to it) for comfort.   Rest as much as possible Ice for 10-15 minutes every 4-6 hours as needed for pain and swelling Elevate above your hip/heart when sitting and laying down  Follow up with sports medicine or orthopedics if symptoms do not improve in the next few days.

## 2021-08-26 NOTE — ED Provider Notes (Signed)
[facility]    CSN: [id number] Arrival date & time: 08/26/21  0762      History   Chief Complaint Chief Complaint  Patient presents with   Toe Injury    HPI Melissa Farmer is a 25 y.o. female.   Patient here for evaluation of left 4th toe.  Reports accidentally kicked a crate yesterday evening.  Reports left 4th toe is painful to touch and stand on.  Has not taken any OTC medication or treatments.  Denies any fevers, chest pain, shortness of breath, N/V/D, numbness, tingling, weakness, abdominal pain, or headaches.    The history is provided by the patient.   Past Medical History:  Diagnosis Date   Medical history non-contributory     Patient Active Problem List   Diagnosis Date Noted   Iron deficiency anemia due to chronic blood loss 06/26/2020   Vitamin D insufficiency 06/26/2020   Closed nondisplaced fracture of styloid process of left ulna 06/20/2020   Anxiety 05/23/2020   Left wrist tendonitis 05/22/2020   Otitis media 09/09/2019   Headache 03/26/2016    Past Surgical History:  Procedure Laterality Date   NO PAST SURGERIES      OB History     Gravida  1   Para  1   Term  1   Preterm      AB      Living  1      SAB      IAB      Ectopic      Multiple  0   Live Births  1            Home Medications    Prior to Admission medications   Medication Sig Start Date End Date Taking? Authorizing Provider  ibuprofen (ADVIL) 600 MG tablet Take 1 tablet (600 mg total) by mouth every 6 (six) hours as needed for mild pain or moderate pain. 06/10/21   Lance Muss, FNP  norgestimate-ethinyl estradiol (ORTHO-CYCLEN) 0.25-35 MG-MCG tablet Take 1 tablet by mouth daily. 03/22/21   Nche, Bonna Gains, NP    Family History Family History  Problem Relation Age of Onset   Kidney disease Maternal Grandmother    Stroke Maternal Grandfather    Diabetes Maternal Grandfather    Heart disease Maternal Grandfather    Cancer Paternal  Grandmother    Asthma Paternal Grandmother    Asthma Father     Social History Social History   Tobacco Use   Smoking status: Never   Smokeless tobacco: Never  Vaping Use   Vaping Use: Never used  Substance Use Topics   Alcohol use: No    Alcohol/week: 0.0 standard drinks   Drug use: No     Allergies   Patient has no known allergies.   Review of Systems Review of Systems  Musculoskeletal:  Positive for arthralgias and joint swelling.  All other systems reviewed and are negative.   Physical Exam Triage Vital Signs ED Triage Vitals  Enc Vitals Group     BP 08/26/21 1039 98/68     Pulse Rate 08/26/21 1039 72     Resp 08/26/21 1039 18     Temp 08/26/21 1039 98.4 F (36.9 C)     Temp src --      SpO2 08/26/21 1039 98 %     Weight --      Height --      Head Circumference --      Peak Flow --  Pain Score 08/26/21 1036 10     Pain Loc --      Pain Edu? --      Excl. in GC? --    No data found.  Updated Vital Signs BP 98/68   Pulse 72   Temp 98.4 F (36.9 C)   Resp 18   LMP 07/18/2021   SpO2 98%   Visual Acuity Right Eye Distance:   Left Eye Distance:   Bilateral Distance:    Right Eye Near:   Left Eye Near:    Bilateral Near:     Physical Exam Vitals and nursing note reviewed.  Constitutional:      General: She is not in acute distress.    Appearance: Normal appearance. She is not ill-appearing, toxic-appearing or diaphoretic.  HENT:     Head: Normocephalic and atraumatic.  Eyes:     Conjunctiva/sclera: Conjunctivae normal.  Cardiovascular:     Rate and Rhythm: Normal rate and regular rhythm.     Pulses: Normal pulses.     Heart sounds: Normal heart sounds.  Pulmonary:     Effort: Pulmonary effort is normal.     Breath sounds: Normal breath sounds.  Abdominal:     General: Abdomen is flat.  Musculoskeletal:        General: Normal range of motion.     Cervical back: Normal range of motion.     Left foot: Normal capillary refill.  Swelling (left 4th toe), tenderness and bony tenderness (left 4th toe with bruising and tenderness) present. Normal pulse.  Skin:    General: Skin is warm and dry.  Neurological:     General: No focal deficit present.     Mental Status: She is alert and oriented to person, place, and time.  Psychiatric:        Mood and Affect: Mood normal.     UC Treatments / Results  Labs (all labs ordered are listed, but only abnormal results are displayed) Labs Reviewed - No data to display  EKG   Radiology DG Toe 4th Left  Result Date: 08/26/2021 CLINICAL DATA:  25 year old female with history of trauma to the left fourth toe complaining of pain. EXAM: LEFT FOURTH TOE COMPARISON:  No priors. FINDINGS: There is no evidence of fracture or dislocation. There is no evidence of arthropathy or other focal bone abnormality. Soft tissues are mildly swollen in the left fourth toe. IMPRESSION: 1. Soft tissue swelling in the left fourth toe without evidence of acute bony injury. Electronically Signed   By: Trudie Reed M.D.   On: 08/26/2021 11:19    Procedures Procedures (including critical care time)  Medications Ordered in UC Medications - No data to display  Initial Impression / Assessment and Plan / UC Course  I have reviewed the triage vital signs and the nursing notes.  Pertinent labs & imaging results that were available during my care of the patient were reviewed by me and considered in my medical decision making (see chart for details).    Assessment negative for red flags or concerns.  Left fourth toe injury.  X-ray with no acute bony abnormality.  Tylenol and or ibuprofen as needed.  May try buddy tape for comfort.  Recommend rest, ice, and elevation.  Follow-up with orthopedics if symptoms do not improve in the next week. Final Clinical Impressions(s) / UC Diagnoses   Final diagnoses:  Injury of toe on left foot, initial encounter     Discharge Instructions  You can take  Tylenol and/or Ibuprofen as needed for pain relief and fever reduction.   You can try buddy tape (tape your toe to the toe next to it) for comfort.   Rest as much as possible Ice for 10-15 minutes every 4-6 hours as needed for pain and swelling Elevate above your hip/heart when sitting and laying down  Follow up with sports medicine or orthopedics if symptoms do not improve in the next few days.      ED Prescriptions   None    PDMP not reviewed this encounter.   Ivette Loyal, NP 08/26/21 1131

## 2021-08-27 ENCOUNTER — Encounter: Payer: Self-pay | Admitting: Nurse Practitioner

## 2021-09-02 ENCOUNTER — Other Ambulatory Visit: Payer: Self-pay

## 2021-09-02 ENCOUNTER — Ambulatory Visit (INDEPENDENT_AMBULATORY_CARE_PROVIDER_SITE_OTHER): Payer: Medicaid Other | Admitting: Nurse Practitioner

## 2021-09-02 ENCOUNTER — Encounter: Payer: Self-pay | Admitting: Nurse Practitioner

## 2021-09-02 VITALS — BP 104/60 | HR 84 | Temp 96.9°F | Ht 66.75 in | Wt 115.8 lb

## 2021-09-02 DIAGNOSIS — Z3200 Encounter for pregnancy test, result unknown: Secondary | ICD-10-CM | POA: Diagnosis not present

## 2021-09-02 DIAGNOSIS — M79674 Pain in right toe(s): Secondary | ICD-10-CM

## 2021-09-02 NOTE — Progress Notes (Signed)
Subjective:  Patient ID: Melissa Farmer, female    DOB: [birth year]  Age: 25 y.o. MRN: [id number]  CC: Acute Visit (ED f/u regarding sprain toe. Pt would like to discuss possibly getting a boot or referral for boot. Pt states her toe has improved a lot since she made the appointment. /Declines flu vaccine. )  HPI: Melissa Farmer presents with left 4th toe pain and swelling post injury 1week ago. She hit her foot again a box while intoxicated. This occurred at home. She has an ED visit on 08/26/21 (x-ray was negative for fracture. Pain is worse with weight bearing activity. Denies any numbness or tingling. Today she also reports absence of menstrual cycle this month. Home pregnancy test looked positive, but she is not sure. She stopped COC 70month ago, LMP /07/24/2021. Denies any pelvic pain or back pain or dysuria or vaginal discharge. She is requesting serum HCG today  Reviewed past Medical, Social and Family history today.  Outpatient Medications Prior to Visit  Medication Sig Dispense Refill   ibuprofen (ADVIL) 600 MG tablet Take 1 tablet (600 mg total) by mouth every 6 (six) hours as needed for mild pain or moderate pain. 30 tablet 0   norgestimate-ethinyl estradiol (ORTHO-CYCLEN) 0.25-35 MG-MCG tablet Take 1 tablet by mouth daily. (Patient not taking: Reported on 09/02/2021) 90 tablet 3   No facility-administered medications prior to visit.    ROS See HPI  Objective:  BP 104/60 (BP Location: Left Arm, Patient Position: Sitting, Cuff Size: Normal)   Pulse 84   Temp (!) 96.9 F (36.1 C) (Temporal)   Ht 5' 6.75" (1.695 m)   Wt 115 lb 12.8 oz (52.5 kg)   LMP 07/24/2021 (Exact Date)   SpO2 100%   BMI 18.27 kg/m   Physical Exam Cardiovascular:     Pulses:          Dorsalis pedis pulses are 2+ on the right side and 2+ on the left side.       Posterior tibial pulses are 2+ on the right side and 2+ on the left side.  Musculoskeletal:     Left ankle: Normal.     Left foot:  Normal.       Feet:  Feet:     Right foot:     Skin integrity: Skin integrity normal.     Left foot:     Skin integrity: Skin integrity normal.     Comments: Mild swelling of 4th metatarsal, no erythema, nail intact, skin intact. Has pain with movement of 4th toe, no deformity. Neurological:     Mental Status: She is alert.   Assessment & Plan:  This visit occurred during the SARS-CoV-2 public health emergency.  Safety protocols were in place, including screening questions prior to the visit, additional usage of staff PPE, and extensive cleaning of exam room while observing appropriate contact time as indicated for disinfecting solutions.   Kelley was seen today for acute visit.  Diagnoses and all orders for this visit:  Encounter for pregnancy test, result unknown -     hCG, serum, qualitative  Pain of toe of right foot Advised to wear wide shoes with adequate support. Instructed on how to splint toe using buddy tape technique. Elevate foot as much as possible, apply cold compress as needed. Start prenatal multivitamins. Schedule appt with OB  Problem List Items Addressed This Visit   None Visit Diagnoses     Encounter for pregnancy test, result unknown    -  Primary   Relevant Orders   hCG, serum, qualitative   Pain of toe of right foot           Follow-up: No follow-ups on file.  Alysia Penna, NP

## 2021-09-02 NOTE — Patient Instructions (Signed)
Go to lab for blood draw Start prenatal multivitamins Schedule appt with OB  How to Buddy Tape Buddy taping refers to taping an injured finger or toe to an uninjured finger or toe that is next to it. This protects the injured finger or toe and reduces movement while the injury heals. You may buddy tape a finger or toe if you have a minor sprain. Your health care provider may buddy tape your finger or toe if you have a sprain, dislocation, or fracture. You may be told to replace your buddy taping as needed. What are the risks? Generally, buddy taping is safe. However, problems may occur, such as: Skin injury or infection. Reduced blood flow to the finger or toe. Skin reaction to the tape. Do not buddy tape your toe if you have diabetes. Do not buddy tape if you know that you have an allergy to adhesives or surgical tape. Supplies needed: Gauze pad, cotton, or cloth. Tape. This may be called first-aid tape, surgical tape, or medical tape. How to buddy tape Before buddy taping Lessen any pain and swelling with rest, icing, and elevation: Avoid activities that cause pain. If directed, put ice on the injured area. To do this: Put ice in a plastic bag. Place a towel between your skin and the bag. Leave the ice on for 20 minutes, 2-3 times a day. Remove the ice if your skin turns bright red. This is very important. If you cannot feel pain, heat, or cold, you have a greater risk of damage to the area. Raise (elevate) your hand or foot above the level of your heart while you are sitting or lying down.  Buddy taping procedure  Clean and dry your finger or toe as told by your health care provider. Place a gauze pad or a piece of cloth or cotton between your injured finger or toe and the uninjured finger or toe. Use tape to wrap around both fingers or toes so your injured finger or toe is secured to the uninjured finger or toe. The tape should be snug, but not tight. Make sure the ends of the  piece of tape overlap. Avoid placing tape directly over the joint. Change the tape and the padding as told by your health care provider. Remove and replace the tape or padding if it becomes loose, worn, dirty, or wet. After buddy taping Watch the buddy-taped area and always remove buddy taping if: Your pain gets worse. Your fingers or toes turn pale or blue. Your skin becomes irritated. Follow these instructions at home: Take over-the-counter and prescription medicines only as told by your health care provider. Return to your normal activities as told by your health care provider. Ask your health care provider what activities are safe for you. Contact a health care provider if: You have pain, swelling, or bruising that lasts longer than 3 days. You have a fever. Your skin is red, cracked, or irritated. Get help right away if: The injured area becomes cold, numb, or pale. You have severe pain, swelling, bruising, or loss of movement in your finger or toe. Your finger or toe changes shape (deformity). Summary Buddy taping refers to taping an injured finger or toe to an uninjured finger or toe that is next to it. You may buddy tape a finger or toe if you have a minor sprain or fracture. Take over-the-counter and prescription medicines only as told by your health care provider. This information is not intended to replace advice given to you by  your health care provider. Make sure you discuss any questions you have with your health care provider. Document Revised: 08/23/2020 Document Reviewed: 08/23/2020 Elsevier Patient Education  2022 [employer].

## 2021-09-03 LAB — HCG, SERUM, QUALITATIVE: Preg, Serum: POSITIVE — AB

## 2021-09-11 IMAGING — DX DG WRIST COMPLETE 3+V*L*
4 series · 4 of 4 positions shown · non-contrast
Comparison: None.

CLINICAL DATA: Chronic left wrist pain.

EXAM:
LEFT WRIST - COMPLETE 3+ VIEW

[wrist pa]
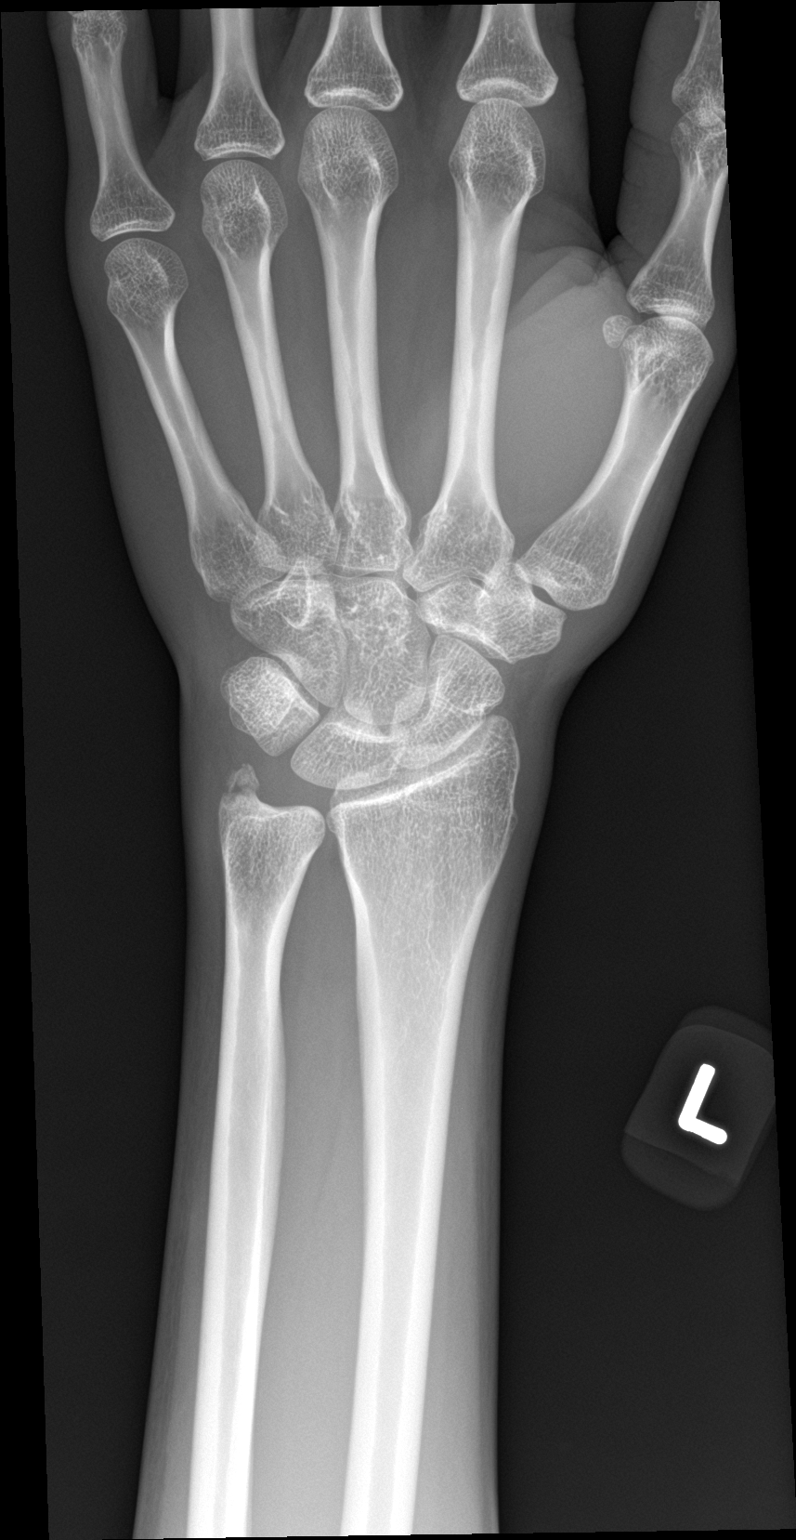

[wrist navicular]
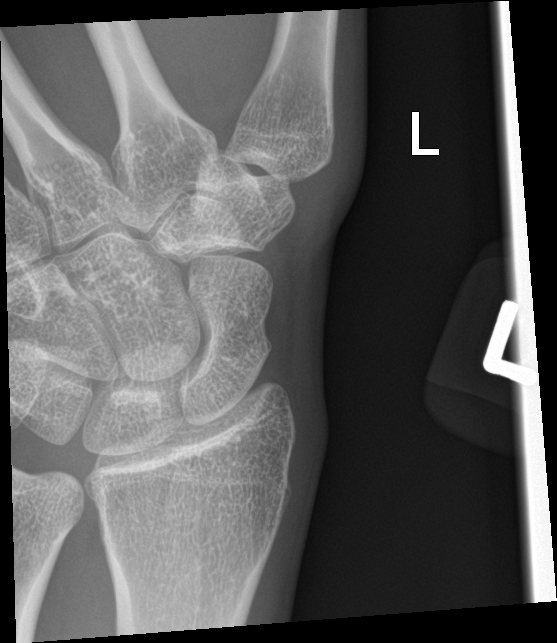

[wrist obl]
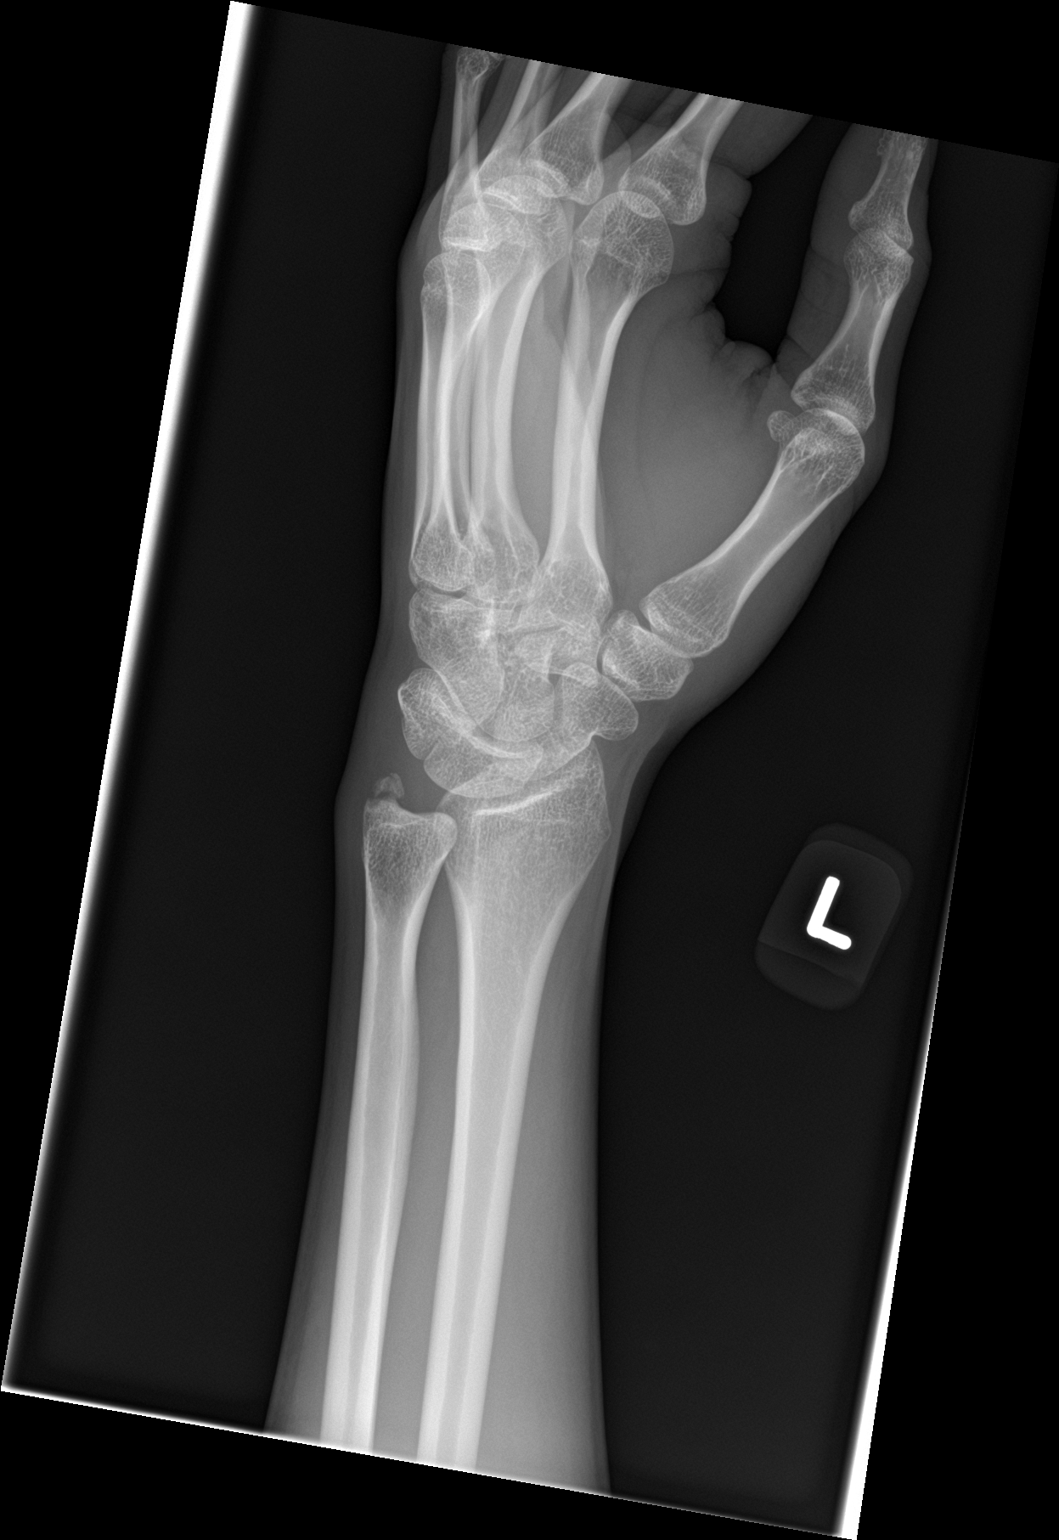

[wrist lat]
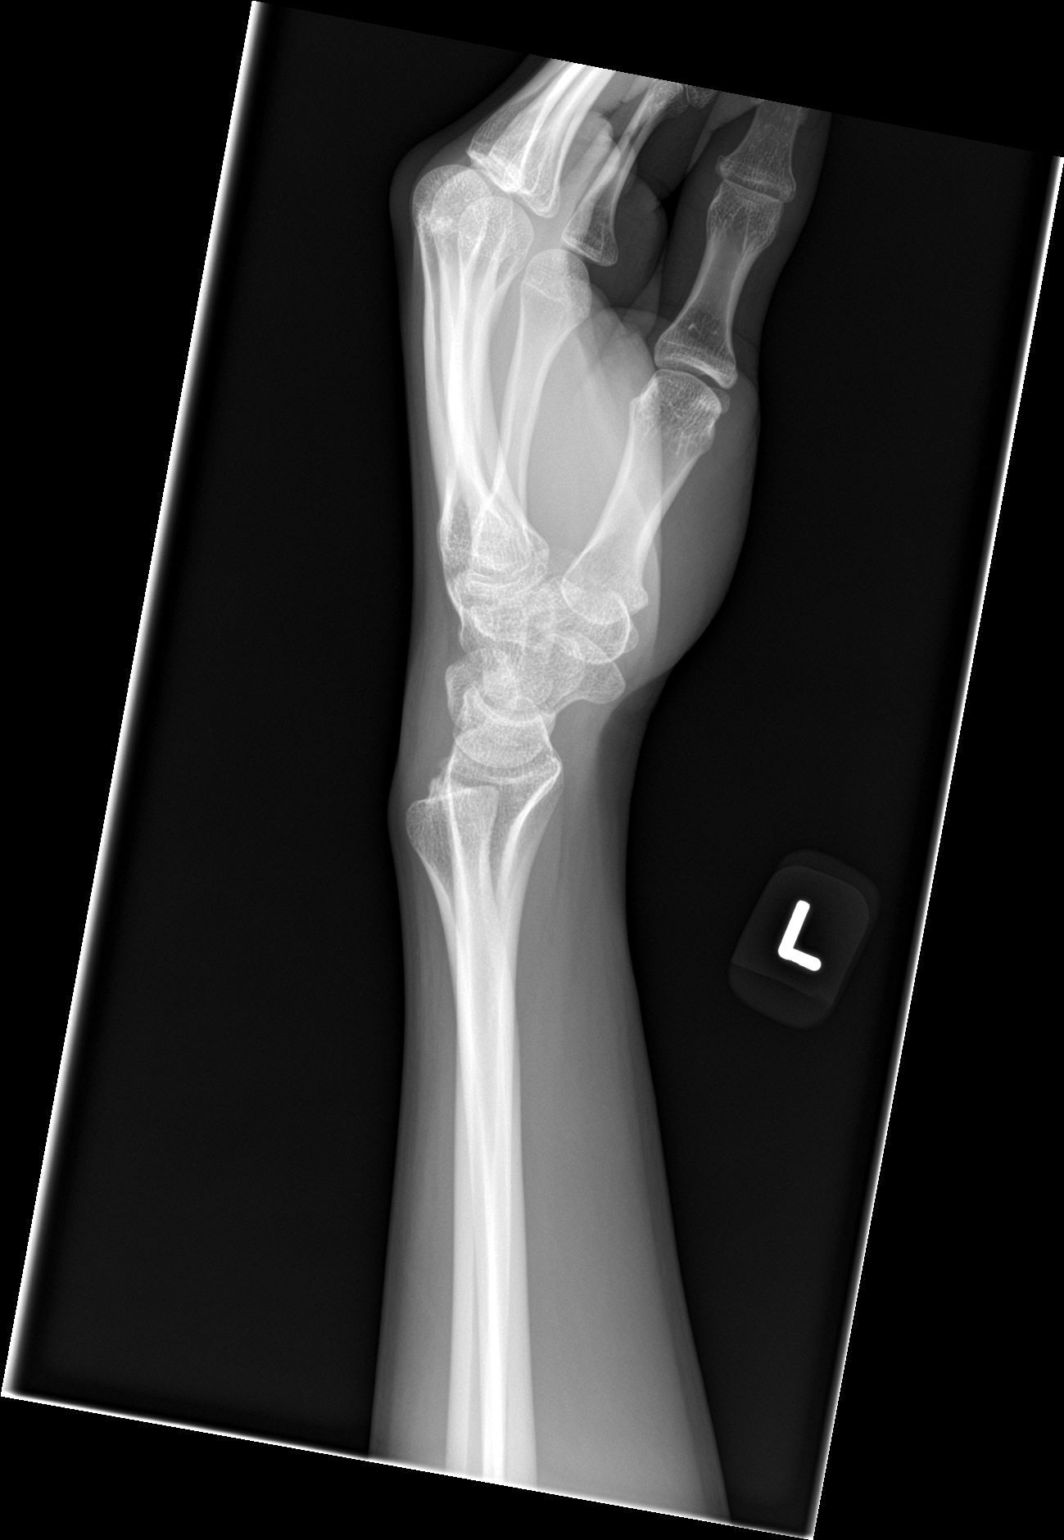

[4 of 4 positions shown; findings below may reference images not displayed]

FINDINGS: Nonunited ulnar styloid fracture. Distal radius and carpal bones
appear normal.
IMPRESSION: Nonunited ulnar styloid fracture.

## 2021-09-24 LAB — HM PAP SMEAR

## 2021-09-25 ENCOUNTER — Ambulatory Visit: Payer: Medicaid Other | Admitting: Nurse Practitioner

## 2021-09-26 ENCOUNTER — Ambulatory Visit: Payer: Medicaid Other | Admitting: Nurse Practitioner

## 2021-11-17 NOTE — L&D Delivery Note (Signed)
Delivery Note She progressed to complete and pushed well with 3 ctx.  At 6:43 AM a viable female was delivered via Vaginal, Spontaneous (Presentation: Left Occiput Anterior).  APGAR: 9, 9; weight pending.   Placenta status: Spontaneous, Intact.  Cord: 3 vessels with the following complications: None.  Anesthesia: None Episiotomy: None Lacerations: None Suture Repair:  none Est. Blood Loss (mL): 100  Mom to postpartum.  Baby to Couplet care / Skin to Skin.  Melissa Farmer Melissa Farmer 04/09/2022, 6:55 AM

## 2022-02-01 ENCOUNTER — Ambulatory Visit (HOSPITAL_COMMUNITY)
Admission: EM | Admit: 2022-02-01 | Discharge: 2022-02-01 | Disposition: A | Discharge status: Home / Self Care | Payer: Medicaid Other | Attending: Emergency Medicine | Admitting: Emergency Medicine

## 2022-02-01 ENCOUNTER — Encounter (HOSPITAL_COMMUNITY): Payer: Self-pay | Admitting: Emergency Medicine

## 2022-02-01 DIAGNOSIS — N898 Other specified noninflammatory disorders of vagina: Secondary | ICD-10-CM | POA: Diagnosis present

## 2022-02-01 MED ORDER — MICONAZOLE NITRATE 2 % VA CREA: 1.0000 | TOPICAL_CREAM | Freq: Every day | VAGINAL | 0 refills | Status: DC

## 2022-02-01 NOTE — ED Triage Notes (Signed)
Pt c/o vaginal itching with some burning and yellowish discharge for 2 days.  ?Reports pregnant about [redacted] weeks pregnant.  ?

## 2022-02-01 NOTE — ED Provider Notes (Signed)
?[facility] ? ? ? ?CSN: [id number] ?Arrival date & time: 02/01/22  1752 ? ? ?  ? ?History   ?Chief Complaint ?Chief Complaint  ?Patient presents with  ? Vaginal Itching  ? ? ?HPI ?Melissa Farmer is a 26 y.o. female.  ? ?Patient presents with vaginal itching, yellow discharge and dysuria for 2 days.  Believes she has a yeast infection but was unsure of which medication she is able to take.  Currently 28 weeks.  Pregnant.  Sexually active, 1 partner, no condom use.  No known exposures.  Denies hematuria, new rash or lesions, abdominal pain or cramping, flank pain, vaginal bleeding or spotting.  Endorses urinary frequency but this has been present throughout pregnancy. ? ?Past Medical History:  ?Diagnosis Date  ? Anemia 2019  ? Anxiety 05/21  ? Medical history non-contributory   ? ? ?Patient Active Problem List  ? Diagnosis Date Noted  ? Iron deficiency anemia due to chronic blood loss 06/26/2020  ? Vitamin D insufficiency 06/26/2020  ? Closed nondisplaced fracture of styloid process of left ulna 06/20/2020  ? Anxiety 05/23/2020  ? Left wrist tendonitis 05/22/2020  ? Otitis media 09/09/2019  ? Headache 03/26/2016  ? ? ?Past Surgical History:  ?Procedure Laterality Date  ? NO PAST SURGERIES    ? ? ?OB History   ? ? Gravida  ?2  ? Para  ?1  ? Term  ?1  ? Preterm  ?   ? AB  ?   ? Living  ?1  ?  ? ? SAB  ?   ? IAB  ?   ? Ectopic  ?   ? Multiple  ?0  ? Live Births  ?1  ?   ?  ?  ? ? ? ?Home Medications   ? ?Prior to Admission medications   ?Not on File  ? ? ?Family History ?Family History  ?Problem Relation Age of Onset  ? Kidney disease Maternal Grandmother   ? Asthma Maternal Grandmother   ? Stroke Maternal Grandfather   ? Diabetes Maternal Grandfather   ? Heart disease Maternal Grandfather   ? Cancer Paternal Grandmother   ? Asthma Paternal Grandmother   ? Asthma Father   ? Stroke Father   ? Diabetes Paternal Uncle   ? ? ?Social History ?Social History  ? ?Tobacco Use  ? Smoking status: Never  ? Smokeless  tobacco: Never  ?Vaping Use  ? Vaping Use: Never used  ?Substance Use Topics  ? Alcohol use: Yes  ?  Alcohol/week: 2.0 standard drinks  ?  Types: 1 Cans of beer, 1 Shots of liquor per week  ? Drug use: No  ? ? ? ?Allergies   ?Patient has no known allergies. ? ? ?Review of Systems ?Review of Systems ?Defer to HPI ? ? ?Physical Exam ?Triage Vital Signs ?ED Triage Vitals  ?Enc Vitals Group  ?   BP 02/01/22 1821 135/81  ?   Pulse Rate 02/01/22 1821 (!) 108  ?   Resp 02/01/22 1821 17  ?   Temp 02/01/22 1821 98.5 ?F (36.9 ?C)  ?   Temp Source 02/01/22 1821 Oral  ?   SpO2 02/01/22 1821 98 %  ?   Weight --   ?   Height --   ?   Head Circumference --   ?   Peak Flow --   ?   Pain Score 02/01/22 1820 7  ?   Pain Loc --   ?  Pain Edu? --   ?   Excl. in GC? --   ? ?No data found. ? ?Updated Vital Signs ?BP 135/81 (BP Location: Right Arm)   Pulse (!) 108   Temp 98.5 ?F (36.9 ?C) (Oral)   Resp 17   LMP 07/24/2021 (Exact Date)   SpO2 98%  ? ?Visual Acuity ?Right Eye Distance:   ?Left Eye Distance:   ?Bilateral Distance:   ? ?Right Eye Near:   ?Left Eye Near:    ?Bilateral Near:    ? ?Physical Exam ?Constitutional:   ?   Appearance: Normal appearance.  ?HENT:  ?   Head: Normocephalic.  ?Eyes:  ?   Extraocular Movements: Extraocular movements intact.  ?Pulmonary:  ?   Effort: Pulmonary effort is normal.  ?Genitourinary: ?   Comments: deferred ?Skin: ?   General: Skin is warm and dry.  ?Neurological:  ?   Mental Status: She is alert and oriented to person, place, and time. Mental status is at baseline.  ?Psychiatric:     ?   Mood and Affect: Mood normal.     ?   Behavior: Behavior normal.  ? ? ? ?UC Treatments / Results  ?Labs ?(all labs ordered are listed, but only abnormal results are displayed) ?Labs Reviewed  ?CERVICOVAGINAL ANCILLARY ONLY  ? ? ?EKG ? ? ?Radiology ?No results found. ? ?Procedures ?Procedures (including critical care time) ? ?Medications Ordered in UC ?Medications - No data to display ? ?Initial Impression /  Assessment and Plan / UC Course  ?I have reviewed the triage vital signs and the nursing notes. ? ?Pertinent labs & imaging results that were available during my care of the patient were reviewed by me and considered in my medical decision making (see chart for details). ? ?Vaginal discharge ? ?We will treat prophylactically for yeast, miconazole vaginal suppository for 7 days prescribed, STI screening pending, will treat per protocol, advised abstinence until treatment is complete, labs have resulted did and symptoms have resolved, advised condom use during all sexual encounters moving forward may follow-up with obstetrician or urgent care as needed ?Final Clinical Impressions(s) / UC Diagnoses  ? ?Final diagnoses:  ?None  ? ?Discharge Instructions   ?None ?  ? ?ED Prescriptions   ?None ?  ? ?PDMP not reviewed this encounter. ?  ?Valinda Hoar, NP ?02/02/22 1005 ? ?

## 2022-02-01 NOTE — Discharge Instructions (Signed)
Today we will prophylactically treat you for yeast ? ?Due to your pregnancy you are unable to take the pill form of treatment ? ?Insert vaginal suppository every night before bed for the next 7 days ? ?Labs pending 2-3 days, you will be contacted if positive for any sti and treatment will be sent to the pharmacy, you will have to return to the clinic if positive for gonorrhea to receive treatment  ? ?Please refrain from having sex until labs results, if positive please refrain from having sex until treatment complete and symptoms resolve  ? ?If positive for  Chlamydia  gonorrhea or trichomoniasis please notify partner or partners so they may tested as well ? ?Moving forward, it is recommended you use some form of protection against the transmission of sti infections  such as condoms or dental dams with each sexual encounter   ? ? ?

## 2022-02-03 ENCOUNTER — Telehealth (HOSPITAL_COMMUNITY): Payer: Self-pay | Admitting: Emergency Medicine

## 2022-02-03 LAB — CERVICOVAGINAL ANCILLARY ONLY
Bacterial Vaginitis (gardnerella): NEGATIVE
Candida Glabrata: NEGATIVE
Candida Vaginitis: POSITIVE — AB
Chlamydia: NEGATIVE
Comment: NEGATIVE
Comment: NEGATIVE
Comment: NEGATIVE
Comment: NEGATIVE
Comment: NEGATIVE
Comment: NORMAL
Neisseria Gonorrhea: NEGATIVE
Trichomonas: POSITIVE — AB

## 2022-02-03 MED ORDER — METRONIDAZOLE 500 MG PO TABS: 500.0000 mg | ORAL_TABLET | Freq: Two times a day (BID) | ORAL | 0 refills | Status: DC

## 2022-02-03 MED ORDER — TERCONAZOLE 0.4 % VA CREA: 1.0000 | TOPICAL_CREAM | Freq: Every day | VAGINAL | 0 refills | Status: AC

## 2022-03-30 LAB — OB RESULTS CONSOLE GBS: GBS: NEGATIVE

## 2022-04-08 ENCOUNTER — Non-Acute Institutional Stay (HOSPITAL_COMMUNITY)
Admission: RE | Admit: 2022-04-08 | Discharge: 2022-04-08 | Disposition: A | Discharge status: Home / Self Care | Payer: Medicaid Other | Source: Ambulatory Visit | Attending: Internal Medicine | Admitting: Internal Medicine

## 2022-04-08 DIAGNOSIS — O99019 Anemia complicating pregnancy, unspecified trimester: Secondary | ICD-10-CM | POA: Insufficient documentation

## 2022-04-08 MED ORDER — SODIUM CHLORIDE 0.9 % IV SOLN
INTRAVENOUS | Status: DC | PRN
Start: 2022-04-08 — End: 2022-04-09

## 2022-04-08 MED ORDER — SODIUM CHLORIDE 0.9 % IV SOLN
510.0000 mg | Freq: Once | INTRAVENOUS | Status: AC
  Administered 2022-04-08: 510 mg via INTRAVENOUS
  Filled 2022-04-08: qty 17

## 2022-04-08 NOTE — Progress Notes (Signed)
PAT[facility]ARE C[facility] NOTE   Diagnosis: Anemia complicating pregnancy, unspecified trimester   Provider: Marlow Baars, MD   Procedure: Feraheme infusion    Note:  Patient received Feraheme 510 mg infusion (dose 1 of 2) via PIV. Patient tolerated infusion well with no adverse reaction. Observed patient for 30 minutes post infusion. Vital signs stable. Discharge instructions given. Patient to come back next week for second infusion. Patient alert, oriented and ambulatory at discharge.

## 2022-04-09 ENCOUNTER — Encounter (HOSPITAL_COMMUNITY): Payer: Self-pay | Admitting: Obstetrics and Gynecology

## 2022-04-09 ENCOUNTER — Inpatient Hospital Stay (HOSPITAL_COMMUNITY)
Admission: AD | Admit: 2022-04-09 | Discharge: 2022-04-10 | DRG: 807 | Disposition: A | Discharge status: Home / Self Care | Payer: Medicaid Other | Attending: Obstetrics and Gynecology | Admitting: Obstetrics and Gynecology

## 2022-04-09 ENCOUNTER — Other Ambulatory Visit: Payer: Self-pay

## 2022-04-09 DIAGNOSIS — Z3A37 37 weeks gestation of pregnancy: Secondary | ICD-10-CM

## 2022-04-09 DIAGNOSIS — D638 Anemia in other chronic diseases classified elsewhere: Secondary | ICD-10-CM | POA: Diagnosis present

## 2022-04-09 DIAGNOSIS — O26893 Other specified pregnancy related conditions, third trimester: Secondary | ICD-10-CM | POA: Diagnosis present

## 2022-04-09 DIAGNOSIS — R030 Elevated blood-pressure reading, without diagnosis of hypertension: Secondary | ICD-10-CM | POA: Diagnosis present

## 2022-04-09 DIAGNOSIS — O9902 Anemia complicating childbirth: Secondary | ICD-10-CM | POA: Diagnosis present

## 2022-04-09 LAB — COMPREHENSIVE METABOLIC PANEL
ALT: 14 U/L (ref 0–44)
AST: 24 U/L (ref 15–41)
Albumin: 2.8 g/dL — ABNORMAL LOW (ref 3.5–5.0)
Alkaline Phosphatase: 152 U/L — ABNORMAL HIGH (ref 38–126)
Anion gap: 6 (ref 5–15)
BUN: 5 mg/dL — ABNORMAL LOW (ref 6–20)
CO2: 22 mmol/L (ref 22–32)
Calcium: 9 mg/dL (ref 8.9–10.3)
Chloride: 107 mmol/L (ref 98–111)
Creatinine, Ser: 0.52 mg/dL (ref 0.44–1.00)
GFR, Estimated: >60 mL/min (ref >60)
Glucose, Bld: 86 mg/dL (ref 70–99)
Potassium: 3.6 mmol/L (ref 3.5–5.1)
Sodium: 135 mmol/L (ref 135–145)
Total Bilirubin: 0.3 mg/dL (ref 0.3–1.2)
Total Protein: 6.6 g/dL (ref 6.5–8.1)

## 2022-04-09 LAB — CBC
HCT: 28.7 % — ABNORMAL LOW (ref 36.0–46.0)
Hemoglobin: 9.8 g/dL — ABNORMAL LOW (ref 12.0–15.0)
MCH: 30.4 pg (ref 26.0–34.0)
MCHC: 34.1 g/dL (ref 30.0–36.0)
MCV: 89.1 fL (ref 80.0–100.0)
Platelets: 194 10*3/uL (ref 150–400)
RBC: 3.22 MIL/uL — ABNORMAL LOW (ref 3.87–5.11)
RDW: 13.8 % (ref 11.5–15.5)
WBC: 7 10*3/uL (ref 4.0–10.5)
nRBC: 0 % (ref 0.0–0.2)

## 2022-04-09 LAB — RPR: RPR Ser Ql: NONREACTIVE

## 2022-04-09 LAB — PROTEIN / CREATININE RATIO, URINE
Creatinine, Urine: 43.3 mg/dL
Protein Creatinine Ratio: 0.37 mg/mg{Cre} — ABNORMAL HIGH (ref 0.00–0.15)
Total Protein, Urine: 16 mg/dL

## 2022-04-09 LAB — TYPE AND SCREEN
ABO/RH(D): O POS
Antibody Screen: NEGATIVE

## 2022-04-09 LAB — POCT FERN TEST: POCT Fern Test: POSITIVE

## 2022-04-09 MED ORDER — BENZOCAINE-MENTHOL 20-0.5 % EX AERO: 1.0000 "application " | INHALATION_SPRAY | CUTANEOUS | Status: DC | PRN

## 2022-04-09 MED ORDER — SOD CITRATE-CITRIC ACID 500-334 MG/5ML PO SOLN: 30.0000 mL | ORAL | Status: DC | PRN

## 2022-04-09 MED ORDER — ONDANSETRON HCL 4 MG/2ML IJ SOLN: 4.0000 mg | INTRAMUSCULAR | Status: DC | PRN

## 2022-04-09 MED ORDER — LACTATED RINGERS IV SOLN: 500.0000 mL | INTRAVENOUS | Status: DC | PRN

## 2022-04-09 MED ORDER — SIMETHICONE 80 MG PO CHEW: 80.0000 mg | CHEWABLE_TABLET | ORAL | Status: DC | PRN

## 2022-04-09 MED ORDER — ZOLPIDEM TARTRATE 5 MG PO TABS: 5.0000 mg | ORAL_TABLET | Freq: Every evening | ORAL | Status: DC | PRN

## 2022-04-09 MED ORDER — MAGNESIUM HYDROXIDE 400 MG/5ML PO SUSP: 30.0000 mL | ORAL | Status: DC | PRN

## 2022-04-09 MED ORDER — METHYLERGONOVINE MALEATE 0.2 MG/ML IJ SOLN: 0.2000 mg | INTRAMUSCULAR | Status: DC | PRN

## 2022-04-09 MED ORDER — OXYCODONE HCL 5 MG PO TABS: 5.0000 mg | ORAL_TABLET | ORAL | Status: DC | PRN

## 2022-04-09 MED ORDER — ACETAMINOPHEN 325 MG PO TABS: 650.0000 mg | ORAL_TABLET | ORAL | Status: DC | PRN

## 2022-04-09 MED ORDER — OXYTOCIN BOLUS FROM INFUSION
333.0000 mL | Freq: Once | INTRAVENOUS | Status: AC
  Administered 2022-04-09: 333 mL via INTRAVENOUS

## 2022-04-09 MED ORDER — LACTATED RINGERS IV SOLN: INTRAVENOUS | Status: DC

## 2022-04-09 MED ORDER — LIDOCAINE HCL (PF) 1 % IJ SOLN: 30.0000 mL | INTRAMUSCULAR | Status: DC | PRN

## 2022-04-09 MED ORDER — FENTANYL CITRATE (PF) 100 MCG/2ML IJ SOLN: 50.0000 ug | INTRAMUSCULAR | Status: DC | PRN

## 2022-04-09 MED ORDER — ONDANSETRON HCL 4 MG/2ML IJ SOLN: 4.0000 mg | Freq: Four times a day (QID) | INTRAMUSCULAR | Status: DC | PRN

## 2022-04-09 MED ORDER — OXYCODONE-ACETAMINOPHEN 5-325 MG PO TABS: 1.0000 | ORAL_TABLET | ORAL | Status: DC | PRN

## 2022-04-09 MED ORDER — TETANUS-DIPHTH-ACELL PERTUSSIS 5-2.5-18.5 LF-MCG/0.5 IM SUSY: 0.5000 mL | PREFILLED_SYRINGE | Freq: Once | INTRAMUSCULAR | Status: DC

## 2022-04-09 MED ORDER — ONDANSETRON HCL 4 MG PO TABS: 4.0000 mg | ORAL_TABLET | ORAL | Status: DC | PRN

## 2022-04-09 MED ORDER — WITCH HAZEL-GLYCERIN EX PADS: 1.0000 "application " | MEDICATED_PAD | CUTANEOUS | Status: DC | PRN

## 2022-04-09 MED ORDER — PRENATAL MULTIVITAMIN CH
1.0000 | ORAL_TABLET | Freq: Every day | ORAL | Status: DC
  Administered 2022-04-09: 1 via ORAL
  Filled 2022-04-09: qty 1

## 2022-04-09 MED ORDER — MEASLES, MUMPS & RUBELLA VAC IJ SOLR: 0.5000 mL | Freq: Once | INTRAMUSCULAR | Status: DC

## 2022-04-09 MED ORDER — SENNOSIDES-DOCUSATE SODIUM 8.6-50 MG PO TABS
2.0000 | ORAL_TABLET | Freq: Every day | ORAL | Status: DC
  Administered 2022-04-10: 2 via ORAL
  Filled 2022-04-09: qty 2

## 2022-04-09 MED ORDER — OXYTOCIN-SODIUM CHLORIDE 30-0.9 UT/500ML-% IV SOLN
2.5000 [IU]/h | INTRAVENOUS | Status: DC
  Filled 2022-04-09: qty 500

## 2022-04-09 MED ORDER — ACETAMINOPHEN 325 MG PO TABS
650.0000 mg | ORAL_TABLET | ORAL | Status: DC | PRN
  Administered 2022-04-09: 650 mg via ORAL
  Filled 2022-04-09: qty 2

## 2022-04-09 MED ORDER — OXYCODONE HCL 5 MG PO TABS: 10.0000 mg | ORAL_TABLET | ORAL | Status: DC | PRN

## 2022-04-09 MED ORDER — METHYLERGONOVINE MALEATE 0.2 MG PO TABS: 0.2000 mg | ORAL_TABLET | ORAL | Status: DC | PRN

## 2022-04-09 MED ORDER — POLYSACCHARIDE IRON COMPLEX 150 MG PO CAPS
150.0000 mg | ORAL_CAPSULE | Freq: Every day | ORAL | Status: DC
Start: 2022-04-09 — End: 2022-04-10
  Administered 2022-04-09 – 2022-04-10 (×2): 150 mg via ORAL
  Filled 2022-04-09 (×2): qty 1

## 2022-04-09 MED ORDER — IBUPROFEN 600 MG PO TABS
600.0000 mg | ORAL_TABLET | Freq: Four times a day (QID) | ORAL | Status: DC
  Administered 2022-04-09 – 2022-04-10 (×4): 600 mg via ORAL
  Filled 2022-04-09 (×4): qty 1

## 2022-04-09 MED ORDER — OXYCODONE-ACETAMINOPHEN 5-325 MG PO TABS: 2.0000 | ORAL_TABLET | ORAL | Status: DC | PRN

## 2022-04-09 MED ORDER — DIBUCAINE (PERIANAL) 1 % EX OINT
1.0000 | TOPICAL_OINTMENT | CUTANEOUS | Status: DC | PRN
Start: 2022-04-09 — End: 2022-04-10

## 2022-04-09 MED ORDER — COCONUT OIL OIL: 1.0000 "application " | TOPICAL_OIL | Status: DC | PRN

## 2022-04-09 MED ORDER — DIPHENHYDRAMINE HCL 25 MG PO CAPS: 25.0000 mg | ORAL_CAPSULE | Freq: Four times a day (QID) | ORAL | Status: DC | PRN

## 2022-04-09 NOTE — H&P (Signed)
Melissa Farmer is a 26 y.o. female, G2 P1001, EGA 37+ weeks with EDC 6-8 presenting for leaking fluid since about 0200, ctx.  On eval in [facility], +fern, VE 4 cm with reg ctx.  PNC complicated by anemia, received IV iron yesterday.  OB History     Gravida  2   Para  1   Term  1   Preterm      AB      Living  1      SAB      IAB      Ectopic      Multiple  0   Live Births  1          Past Medical History:  Diagnosis Date   Anemia 2019   Anxiety 05/21   Medical history non-contributory    Past Surgical History:  Procedure Laterality Date   NO PAST SURGERIES     Family History: family history includes Asthma in her father, maternal grandmother, and paternal grandmother; Cancer in her paternal grandmother; Diabetes in her maternal grandfather and paternal uncle; Heart disease in her maternal grandfather; Kidney disease in her maternal grandmother; Stroke in her father and maternal grandfather. Social History:  reports that she has never smoked. She has never used smokeless tobacco. She reports current alcohol use of about 2.0 standard drinks per week. She reports that she does not use drugs.     Maternal Diabetes: No Genetic Screening: Normal Maternal Ultrasounds/Referrals: Normal Fetal Ultrasounds or other Referrals:  None Maternal Substance Abuse:  No Significant Maternal Medications:  None Significant Maternal Lab Results:  Group B Strep negative Other Comments:  None  Review of Systems  Respiratory: Negative.    Cardiovascular: Negative.   Maternal Medical History:  Reason for admission: Rupture of membranes and contractions.   Contractions: Frequency: regular.   Perceived severity is moderate.   Fetal activity: Perceived fetal activity is normal.   Prenatal Complications - Diabetes: none.  AROM forebag clear Dilation: 8 Effacement (%): 90 Station: 0 Exam by:: Demarian Epps, MD Blood pressure (!) 151/84, pulse 82, temperature 98.2 F (36.8 C),  temperature source Oral, resp. rate 18, last menstrual period 07/24/2021, SpO2 99 %. Maternal Exam:  Uterine Assessment: Contraction strength is moderate.  Contraction frequency is regular.  Abdomen: Patient reports no abdominal tenderness. Estimated fetal weight is 7 lbs.   Fetal presentation: vertex Introitus: Normal vulva. Normal vagina.  Ferning test: positive.  Amniotic fluid character: clear. Pelvis: adequate for delivery.     Fetal Exam Fetal Monitor Review: Mode: ultrasound.   Baseline rate: 140-150.  Variability: moderate (6-25 bpm).   Pattern: accelerations present and no decelerations.   Fetal State Assessment: Category I - tracings are normal.  Physical Exam Vitals reviewed.  Constitutional:      Appearance: Normal appearance.  Cardiovascular:     Rate and Rhythm: Normal rate and regular rhythm.  Pulmonary:     Effort: Pulmonary effort is normal. No respiratory distress.  Abdominal:     Palpations: Abdomen is soft.  Genitourinary:    General: Normal vulva.  Neurological:     Mental Status: She is alert.    Prenatal labs: ABO, Rh: --/--/O POS (05/24 0230) Antibody: NEG (05/24 0230) Rubella:  immune RPR:  NR  HBsAg:  neg  HIV:  NR  GBS: Negative/-- (05/14 0000)   Assessment/Plan: IUP at 37+ weeks with SROM in active labor.  Making progress, anticipate SVD.  BP 130-150/80s, labs normal except UPC 0.37 so  qualifies for preeclampsia without severe features, will monitor BP and treat prn  Leighton Roach Elmarie Devlin 04/09/2022, 6:08 AM

## 2022-04-09 NOTE — Progress Notes (Signed)
MOB was referred for history of anxiety. * Referral screened out by Clinical Social Worker because none of the following criteria appear to apply: ~ History of anxiety during this pregnancy, or of post-partum depression following prior delivery. No concerns of anxiety noted in prenatal records.  ~ Diagnosis of anxiety within last 3 years. Per chart review, MOB's anxiety dates back to January 2020.  OR * MOB's symptoms currently being treated with medication and/or therapy.  Please contact the Clinical Social Worker if needs arise, by MOB request, or if MOB scores greater than 9/yes to question 10 on Edinburgh Postpartum Depression Screen.  Rishika Mccollom, LCSW Clinical Social Worker [facility] Cell#: [phone]   

## 2022-04-09 NOTE — MAU Note (Signed)
.  Melissa Farmer is a 26 y.o. at [redacted]w[redacted]d here in MAU reporting: CTX since 0000 every 5-6 mins with increasing intensity, and SROM at 0200 in the lobby of clear odorless fluid. Pt denies DFM, VB, abnormal discharge, PIH s/s, complications in the pregnancy, and recent intercourse in the last 48 hours.  GBS neg SVE last week 1 cm  Onset of complaint: 0000 Pain score: 7/10 Vitals:   04/09/22 0213 04/09/22 0215  BP:  137/88  Pulse:  96  Resp:  18  Temp:  98.2 F (36.8 C)  SpO2: 100% 100%     FHT:145 Lab orders placed from triage:  [facility]

## 2022-04-10 LAB — CBC
HCT: 28.3 % — ABNORMAL LOW (ref 36.0–46.0)
Hemoglobin: 9.3 g/dL — ABNORMAL LOW (ref 12.0–15.0)
MCH: 29.2 pg (ref 26.0–34.0)
MCHC: 32.9 g/dL (ref 30.0–36.0)
MCV: 89 fL (ref 80.0–100.0)
Platelets: 177 10*3/uL (ref 150–400)
RBC: 3.18 MIL/uL — ABNORMAL LOW (ref 3.87–5.11)
RDW: 13.8 % (ref 11.5–15.5)
WBC: 9.9 10*3/uL (ref 4.0–10.5)
nRBC: 0 % (ref 0.0–0.2)

## 2022-04-10 LAB — BIRTH TISSUE RECOVERY COLLECTION (PLACENTA DONATION)

## 2022-04-10 MED ORDER — COVID-19MRNA BIVAL VACC PFIZER 30 MCG/0.3ML IM SUSP
0.3000 mL | Freq: Once | INTRAMUSCULAR | Status: AC
  Administered 2022-04-10: 0.3 mL via INTRAMUSCULAR
  Filled 2022-04-10: qty 0.3

## 2022-04-10 MED ORDER — IBUPROFEN 600 MG PO TABS
600.0000 mg | ORAL_TABLET | Freq: Four times a day (QID) | ORAL | 1 refills | Status: DC | PRN
Start: 2022-04-10 — End: 2023-10-23

## 2022-04-10 NOTE — Progress Notes (Signed)
Post Partum Day 1 Subjective: no complaints, up ad lib, voiding, tolerating PO, + flatus, and lochia mild - small clots only. She denies dizziness, CP, SOB. She is bonding well with baby; breast/bottlefeeding. She would like a covid booster today and to be discharged home   Objective: Blood pressure 120/71, pulse 66, temperature 98 F (36.7 C), temperature source Oral, resp. rate 16, last menstrual period 07/24/2021, SpO2 100 %, unknown if currently breastfeeding.  Physical Exam:  General: alert, cooperative, fatigued, and no distress Lochia: appropriate Uterine Fundus: firm Incision: n/a DVT Evaluation: No evidence of DVT seen on physical exam. No significant calf/ankle edema.  Recent Labs    04/09/22 0230 04/10/22 0607  HGB 9.8* 9.3*  HCT 28.7* 28.3*    Assessment/Plan: Discharge home and Breastfeeding Covid booster prior to discharge    LOS: 1 day   Britne Borelli W Jules Vidovich 04/10/2022, 9:10 AM

## 2022-04-10 NOTE — Discharge Instructions (Signed)
Call office with any concerns [phone] 

## 2022-04-10 NOTE — Discharge Summary (Signed)
Postpartum Discharge Summary  Date of Service updated      Patient Name: Melissa Farmer DOB: 1996-07-05 MRN: [id number]  Date of admission: 04/09/2022 Delivery date:04/09/2022  Delivering provider: Willis Modena, TODD  Date of discharge: 04/10/2022  Admitting diagnosis: Indication for care in labor or delivery [O75.9] Intrauterine pregnancy: [redacted]w[redacted]d     Secondary diagnosis:  Principal Problem:   Indication for care in labor or delivery  Additional problems: anemic of chronic disease D 63    Discharge diagnosis: Term Pregnancy Delivered                                              Post partum procedures: outpatient iron infusion planned  Augmentation: N/A Complications: None  Hospital course: Onset of Labor With Vaginal Delivery      26 y.o. yo [id number] at [redacted]w[redacted]d was admitted in Active Labor on 04/09/2022. Patient had an uncomplicated labor course as follows:  Membrane Rupture Time/Date: 2:00 AM ,04/09/2022   Delivery Method:Vaginal, Spontaneous  Episiotomy: None  Lacerations:  None  Patient had an uncomplicated postpartum course.  She is ambulating, tolerating a regular diet, passing flatus, and urinating well. Patient is discharged home in stable condition on 04/10/22.  Newborn Data: Birth date:04/09/2022  Birth time:6:43 AM  Gender:Female  Living status:Living  Apgars:9 ,9  Weight:3090 g   Magnesium Sulfate received: No BMZ received: No Transfusion:Yes - prior to admission   Physical exam  Vitals:   04/09/22 1430 04/09/22 1815 04/09/22 2045 04/10/22 0644  BP: 115/76 128/65 127/66 120/71  Pulse: 89 67 86 66  Resp: 16 18 18 16   Temp: 98.3 F (36.8 C) 98.4 F (36.9 C) 97.8 F (36.6 C) 98 F (36.7 C)  TempSrc: Oral Oral Oral Oral  SpO2: 98% 100% 100% 100%   General: alert, cooperative, and no distress Lochia: appropriate Uterine Fundus: firm Incision: N/A DVT Evaluation: No evidence of DVT seen on physical exam. Labs: Lab Results  Component Value Date   WBC  9.9 04/10/2022   HGB 9.3 (L) 04/10/2022   HCT 28.3 (L) 04/10/2022   MCV 89.0 04/10/2022   PLT 177 04/10/2022      Latest Ref Rng & Units 04/09/2022    2:30 AM  CMP  Glucose 70 - 99 mg/dL 86    BUN 6 - 20 mg/dL 5    Creatinine 0.44 - 1.00 mg/dL 0.52    Sodium 135 - 145 mmol/L 135    Potassium 3.5 - 5.1 mmol/L 3.6    Chloride 98 - 111 mmol/L 107    CO2 22 - 32 mmol/L 22    Calcium 8.9 - 10.3 mg/dL 9.0    Total Protein 6.5 - 8.1 g/dL 6.6    Total Bilirubin 0.3 - 1.2 mg/dL 0.3    Alkaline Phos 38 - 126 U/L 152    AST 15 - 41 U/L 24    ALT 0 - 44 U/L 14     Edinburgh Score:    04/09/2022    6:19 PM  Edinburgh Postnatal Depression Scale Screening Tool  I have been able to laugh and see the funny side of things. 0  I have looked forward with enjoyment to things. 0  I have blamed myself unnecessarily when things went wrong. 1  I have been anxious or worried for no good reason. 0  I have felt scared  or panicky for no good reason. 0  Things have been getting on top of me. 0  I have been so unhappy that I have had difficulty sleeping. 0  I have felt sad or miserable. 0  I have been so unhappy that I have been crying. 0  The thought of harming myself has occurred to me. 0  Edinburgh Postnatal Depression Scale Total 1      After visit meds:  Allergies as of 04/10/2022   No Known Allergies      Medication List     STOP taking these medications    metroNIDAZOLE 500 MG tablet Commonly known as: FLAGYL   prenatal multivitamin Tabs tablet       TAKE these medications    ferrous sulfate 325 (65 FE) MG EC tablet Take 325 mg by mouth 3 (three) times daily with meals.   ibuprofen 600 MG tablet Commonly known as: ADVIL Take 1 tablet (600 mg total) by mouth every 6 (six) hours as needed for moderate pain or cramping.         Discharge home in stable condition Infant Feeding: Bottle and Breast Infant Disposition:home with mother Discharge instruction: per After  Visit Summary and Postpartum booklet. Activity: Advance as tolerated. Pelvic rest for 6 weeks.  Diet: routine diet Anticipated Birth Control: Unsure Postpartum Appointment:6 weeks Additional Postpartum F/U: Postpartum Depression checkup Future Appointments: Future Appointments  Date Time Provider [place]  04/15/2022  8:30 AM [facility] RM 2 [facility] None  06/30/2022  8:40 AM Nche, Charlene Brooke, NP [facility]   Follow up Visit:  Follow-up Information     Ob/Gyn, [facility]. Schedule an appointment as soon as possible for a visit in 6 week(s).   Why: For postpartum visit   Keep appointment for iron infusion next week ( already scheduled ) Contact information: [place] [address] [phone]                     04/10/2022 Isaiah Serge, DO

## 2022-04-15 ENCOUNTER — Encounter (HOSPITAL_COMMUNITY): Payer: Medicaid Other

## 2022-04-16 ENCOUNTER — Non-Acute Institutional Stay (HOSPITAL_COMMUNITY)
Admission: RE | Admit: 2022-04-16 | Discharge: 2022-04-16 | Disposition: A | Discharge status: Home / Self Care | Payer: Medicaid Other | Source: Ambulatory Visit | Attending: Internal Medicine | Admitting: Internal Medicine

## 2022-04-16 DIAGNOSIS — O99019 Anemia complicating pregnancy, unspecified trimester: Secondary | ICD-10-CM | POA: Diagnosis present

## 2022-04-16 DIAGNOSIS — Z3A00 Weeks of gestation of pregnancy not specified: Secondary | ICD-10-CM | POA: Insufficient documentation

## 2022-04-16 MED ORDER — SODIUM CHLORIDE 0.9 % IV SOLN
510.0000 mg | Freq: Once | INTRAVENOUS | Status: AC
  Administered 2022-04-16: 510 mg via INTRAVENOUS
  Filled 2022-04-16: qty 510

## 2022-04-16 MED ORDER — SODIUM CHLORIDE 0.9 % IV SOLN
INTRAVENOUS | Status: DC | PRN
Start: 2022-04-16 — End: 2022-04-17

## 2022-04-16 NOTE — Progress Notes (Signed)
PATIENT CARE CENTER NOTE:  Diagnosis: Anemia of chronic disease D.63  Provider: Marlow Baars MD  Procedure: Feraheme 510mg     Patient received IV Feraheme ( dose 2 of 2).  No premeds required per orders. Tolerated infusion well with no adverse reactions. Observed for at least 30 minutes post infusion. Vitals stable. Pt declined AVS.Patient alert, oriented, and ambulatory at the time of discharge.

## 2022-04-18 ENCOUNTER — Other Ambulatory Visit (HOSPITAL_COMMUNITY)
Admission: RE | Admit: 2022-04-18 | Discharge: 2022-04-18 | Disposition: A | Discharge status: Home / Self Care | Payer: Medicaid Other | Source: Ambulatory Visit | Attending: Nurse Practitioner | Admitting: Nurse Practitioner

## 2022-04-18 ENCOUNTER — Encounter: Payer: Self-pay | Admitting: Nurse Practitioner

## 2022-04-18 ENCOUNTER — Ambulatory Visit (INDEPENDENT_AMBULATORY_CARE_PROVIDER_SITE_OTHER): Payer: Medicaid Other | Admitting: Nurse Practitioner

## 2022-04-18 VITALS — BP 110/80 | HR 62 | Temp 97.0°F | Ht 66.0 in | Wt 126.6 lb

## 2022-04-18 DIAGNOSIS — Z113 Encounter for screening for infections with a predominantly sexual mode of transmission: Secondary | ICD-10-CM | POA: Diagnosis not present

## 2022-04-18 DIAGNOSIS — Z30011 Encounter for initial prescription of contraceptive pills: Secondary | ICD-10-CM | POA: Diagnosis not present

## 2022-04-18 LAB — POCT URINE PREGNANCY: Preg Test, Ur: NEGATIVE

## 2022-04-18 MED ORDER — NORETHINDRONE 0.35 MG PO TABS: 1.0000 | ORAL_TABLET | Freq: Every day | ORAL | 3 refills | Status: DC

## 2022-04-18 MED ORDER — PRENATAL MULTIVITAMIN CH: 1.0000 | ORAL_TABLET | Freq: Every day | ORAL | Status: DC

## 2022-04-18 NOTE — Assessment & Plan Note (Addendum)
1week postpartum, breastfeeding. Upcoming appt with GYN 05/12/22. She denies any postpartum depression. Support at home by significant other. She wants to resume oral contraception.  Previous use of depoprovera injection caused menorrhagia  Urine pregnancy POCT: negative Maintain daily prenatal MVI and iron infusion Start oral progesterone only Maintain upcoming appt with GYN

## 2022-04-18 NOTE — Progress Notes (Signed)
Established Patient Visit  Patient: Melissa Farmer   DOB: 05-Oct-1996   26 y.o. Female  MRN: [id number] Visit Date: 04/18/2022  Subjective:    Chief Complaint  Patient presents with   Acute Visit    Interested in changing [facility] methods STD check  Pregnancy test Negative   HPI Encounter for initial prescription of contraceptive pills 1week postpartum, breastfeeding. Upcoming appt with GYN 05/12/22. She denies any postpartum depression. Support at home by significant other. She wants to resume oral contraception.  Previous use of depoprovera injection caused menorrhagia  Urine pregnancy POCT: negative Maintain daily prenatal MVI and iron infusion Start oral progesterone only Maintain upcoming appt with GYN      04/18/2022    8:51 AM 06/28/2021   10:19 AM 06/11/2020    3:15 PM  Depression screen PHQ 2/9  Decreased Interest 0 0 0  Down, Depressed, Hopeless 0 0 0  PHQ - 2 Score 0 0 0  Altered sleeping 1 0   Tired, decreased energy 1 2   Change in appetite 1 0   Feeling bad or failure about yourself  0 0   Trouble concentrating 0 0   Moving slowly or fidgety/restless 0 0   Suicidal thoughts 0 0   PHQ-9 Score 3 2   Difficult doing work/chores Somewhat difficult Somewhat difficult        04/18/2022    8:51 AM 06/28/2021   10:19 AM 05/22/2020    1:49 PM 09/21/2018    9:50 AM  GAD 7 : Generalized Anxiety Score  Nervous, Anxious, on Edge 0 1 2 0  Control/stop worrying 0 0 2 0  Worry too much - different things 0 1 3 0  Trouble relaxing 0 0 1 2  Restless 0 0 0 2  Easily annoyed or irritable 1 0 2 0  Afraid - awful might happen 0 0 3 0  Total GAD 7 Score 1 2 13 4   Anxiety Difficulty Somewhat difficult Not difficult at all       Reviewed medical, surgical, and social history today  Medications: Outpatient Medications Prior to Visit  Medication Sig   ferrous sulfate 325 (65 FE) MG EC tablet Take 325 mg by mouth 3 (three) times daily with meals.   ibuprofen  (ADVIL) 600 MG tablet Take 1 tablet (600 mg total) by mouth every 6 (six) hours as needed for moderate pain or cramping.   No facility-administered medications prior to visit.   Reviewed past medical and social history.   ROS per HPI above  Last CBC Lab Results  Component Value Date   WBC 9.9 04/10/2022   HGB 9.3 (L) 04/10/2022   HCT 28.3 (L) 04/10/2022   MCV 89.0 04/10/2022   MCH 29.2 04/10/2022   RDW 13.8 04/10/2022   PLT 177 04/10/2022   Last metabolic panel Lab Results  Component Value Date   GLUCOSE 86 04/09/2022   NA 135 04/09/2022   K 3.6 04/09/2022   CL 107 04/09/2022   CO2 22 04/09/2022   BUN 5 (L) 04/09/2022   CREATININE 0.52 04/09/2022   GFRNONAA >60 04/09/2022   CALCIUM 9.0 04/09/2022   PROT 6.6 04/09/2022   ALBUMIN 2.8 (L) 04/09/2022   BILITOT 0.3 04/09/2022   ALKPHOS 152 (H) 04/09/2022   AST 24 04/09/2022   ALT 14 04/09/2022   ANIONGAP 6 04/09/2022        Objective:  BP  110/80 (BP Location: Right Arm, Patient Position: Sitting, Cuff Size: Small)   Pulse 62   Temp (!) 97 F (36.1 C) (Temporal)   Ht 5\' 6"  (1.676 m)   Wt 126 lb 9.6 oz (57.4 kg)   SpO2 99%   Breastfeeding Yes   BMI 20.43 kg/m      Physical Exam Vitals reviewed.  Cardiovascular:     Rate and Rhythm: Normal rate.     Pulses: Normal pulses.  Pulmonary:     Effort: Pulmonary effort is normal.  Musculoskeletal:     Right lower leg: No edema.     Left lower leg: No edema.  Neurological:     Mental Status: She is alert and oriented to person, place, and time.  Psychiatric:        Mood and Affect: Mood normal.        Behavior: Behavior normal.        Thought Content: Thought content normal.    No results found for any visits on 04/18/22.    Assessment & Plan:    Problem List Items Addressed This Visit       Other   Encounter for initial prescription of contraceptive pills - Primary    1week postpartum, breastfeeding. Upcoming appt with GYN 05/12/22. She denies any  postpartum depression. Support at home by significant other. She wants to resume oral contraception.  Previous use of depoprovera injection caused menorrhagia  Urine pregnancy POCT: negative Maintain daily prenatal MVI and iron infusion Start oral progesterone only Maintain upcoming appt with GYN       Relevant Medications   norethindrone (MICRONOR) 0.35 MG tablet   Other Relevant Orders   POCT urine pregnancy   Other Visit Diagnoses     Screen for STD (sexually transmitted disease)       Relevant Orders   Urine cytology ancillary only      Return in about 6 months (around 10/18/2022) for CPE (fasting).     14/12/2021, NP

## 2022-04-18 NOTE — Patient Instructions (Signed)
Maintain appt with GYN for postpartum visit Start camila oral contraception

## 2022-04-21 LAB — URINE CYTOLOGY ANCILLARY ONLY
Chlamydia: NEGATIVE
Comment: NEGATIVE
Comment: NEGATIVE
Comment: NORMAL
Neisseria Gonorrhea: NEGATIVE
Trichomonas: NEGATIVE

## 2022-05-15 ENCOUNTER — Encounter: Payer: Medicaid Other | Admitting: Nurse Practitioner

## 2022-05-15 ENCOUNTER — Telehealth: Payer: Self-pay | Admitting: Nurse Practitioner

## 2022-05-15 NOTE — Telephone Encounter (Signed)
Pt was a no show for her OV with Claris Gower on 05/15/22, I sent a no show letter.

## 2022-05-16 NOTE — Progress Notes (Signed)
This encounter was created in error - please disregard.

## 2022-06-17 ENCOUNTER — Encounter: Payer: Self-pay | Admitting: Nurse Practitioner

## 2022-06-17 ENCOUNTER — Other Ambulatory Visit (HOSPITAL_COMMUNITY)
Admission: RE | Admit: 2022-06-17 | Discharge: 2022-06-17 | Disposition: A | Discharge status: Home / Self Care | Payer: Medicaid Other | Source: Ambulatory Visit | Attending: Nurse Practitioner | Admitting: Nurse Practitioner

## 2022-06-17 ENCOUNTER — Ambulatory Visit (INDEPENDENT_AMBULATORY_CARE_PROVIDER_SITE_OTHER): Payer: Medicaid Other | Admitting: Nurse Practitioner

## 2022-06-17 VITALS — BP 118/78 | HR 95 | Temp 97.1°F | Ht 66.0 in | Wt 123.0 lb

## 2022-06-17 DIAGNOSIS — N898 Other specified noninflammatory disorders of vagina: Secondary | ICD-10-CM | POA: Insufficient documentation

## 2022-06-17 DIAGNOSIS — N760 Acute vaginitis: Secondary | ICD-10-CM

## 2022-06-17 DIAGNOSIS — B9689 Other specified bacterial agents as the cause of diseases classified elsewhere: Secondary | ICD-10-CM | POA: Diagnosis not present

## 2022-06-17 NOTE — Progress Notes (Signed)
                Established Patient Visit  Patient: Melissa Farmer   DOB: 1996-05-27   25 y.o. Female  MRN: [id number] Visit Date: 06/17/2022  Subjective:    Chief Complaint  Patient presents with   Acute Visit    C/o irritation around vagina opening. Thinks It could be BV STD testing  No other concerns.   Vaginal Itching The patient's primary symptoms include genital itching. The patient's pertinent negatives include no genital lesions, genital odor, genital rash, missed menses, pelvic pain, vaginal bleeding or vaginal discharge. This is a new problem. The current episode started in the past 7 days. The problem occurs constantly. The problem has been unchanged. The patient is experiencing no pain. She is not pregnant. Pertinent negatives include no back pain, discolored urine, flank pain, frequency, hematuria, nausea, painful intercourse, rash or urgency. Nothing aggravates the symptoms. She has tried nothing for the symptoms. She is sexually active. It is possible that her partner has an STD. She uses oral contraceptives for contraception. Her menstrual history has been regular. There is no history of herpes simplex, PID, an STD or vaginosis.   Reviewed medical, surgical, and social history today  Medications: Outpatient Medications Prior to Visit  Medication Sig   ferrous sulfate 325 (65 FE) MG EC tablet Take 325 mg by mouth 3 (three) times daily with meals.   ibuprofen (ADVIL) 600 MG tablet Take 1 tablet (600 mg total) by mouth every 6 (six) hours as needed for moderate pain or cramping.   norethindrone (MICRONOR) 0.35 MG tablet Take 1 tablet (0.35 mg total) by mouth daily.   Prenatal Vit-Fe Fumarate-FA (PRENATAL MULTIVITAMIN) TABS tablet Take 1 tablet by mouth daily at 12 noon.   ZAFEMY 150-35 MCG/24HR transdermal patch 1 patch once a week.   No facility-administered medications prior to visit.   Reviewed past medical and social history.   ROS per HPI above      Objective:   BP 118/78 (BP Location: Right Arm, Patient Position: Sitting, Cuff Size: Small)   Pulse 95   Temp (!) 97.1 F (36.2 C) (Temporal)   Ht 5\' 6"  (1.676 m)   Wt 123 lb (55.8 kg)   SpO2 99%   BMI 19.85 kg/m      Physical Exam Genitourinary:    Comments: Decline pelvic exam Neurological:     Mental Status: She is alert and oriented to person, place, and time.     No results found for any visits on 06/17/22.    Assessment & Plan:    Problem List Items Addressed This Visit   None Visit Diagnoses     Vaginal odor    -  Primary   Relevant Orders   Cervicovaginal ancillary only( [place])      Return if symptoms worsen or fail to improve.     08/17/22, NP

## 2022-06-17 NOTE — Patient Instructions (Addendum)
Avoid soaps that are irritants. Go will be contacted with results.  Vaginitis  Vaginitis is a condition in which the vaginal tissue swells and becomes irritated. This condition is most often caused by a change in the normal balance of bacteria and yeast that live in the vagina. This change causes an overgrowth of certain bacteria or yeast, which causes the inflammation. There are different types of vaginitis. What are the causes? The cause of this condition depends on the type of vaginitis. It can be caused by: Bacteria (bacterial vaginosis). Yeast, which is a fungus (candidiasis). A parasite (trichomoniasis vaginitis). A virus (viral vaginitis). Low hormone levels (atrophic vaginitis). Low hormone levels can occur during pregnancy, breastfeeding, or after menopause. Irritants, such as bubble baths, scented tampons, and feminine sprays (allergic vaginitis). Other factors can change the normal balance of the yeast and bacteria that live in the vagina. These include: Antibiotic medicines. Poor hygiene. Diaphragms, vaginal sponges, spermicides, birth control pills, and intrauterine devices (IUDs). Sex. Infection. Uncontrolled diabetes. A weakened body defense system (immune system). What increases the risk? This condition is more likely to develop in women who: Smoke or are exposed to secondhand smoke. Use vaginal douches, scented tampons, or scented sanitary pads. Wear tight-fitting pants or thong underwear. Use oral birth control pills or an IUD. Have sex without a condom or have multiple partners. Have an STI. Frequently use the spermicide nonoxynol-9. Eat lots of foods high in sugar or who have uncontrolled diabetes. Have low estrogen levels. Have a weakened immune system from an immune disorder or medical treatment. Are pregnant or breastfeeding. What are the signs or symptoms? Symptoms vary depending on the cause of the vaginitis. Common symptoms include: Abnormal vaginal  discharge. The discharge is white, gray, or yellow with bacterial vaginosis. The discharge is thick, white, and cheesy with a yeast infection. The discharge is frothy and yellow or greenish with trichomoniasis. A bad vaginal smell. The smell is fishy with bacterial vaginosis. Vaginal itching, pain, or swelling. Pain with sex. Pain or burning when urinating. Sometimes there are no symptoms. How is this diagnosed? This condition is diagnosed based on your symptoms and medical history. A physical exam, including a pelvic exam, will also be done. You may also have other tests, including: Tests to determine the pH level (acidity or alkalinity) of your vagina. A whiff test to assess the odor that results when a sample of your vaginal discharge is mixed with a potassium hydroxide solution. Tests of vaginal fluid. A sample will be examined under a microscope. How is this treated? Treatment varies depending on the type of vaginitis you have. Your treatment may include: Antibiotic creams or pills to treat bacterial vaginosis and trichomoniasis. Antifungal medicines, such as vaginal creams or suppositories, to treat a yeast infection. Medicine to ease discomfort if you have viral vaginitis. Your sexual partner should also be treated. Estrogen delivered in a cream, pill, suppository, or vaginal ring to treat atrophic vaginitis. If vaginal dryness occurs, lubricants and moisturizing creams may help. You may need to avoid scented soaps, sprays, or douches. Stopping use of a product that is causing allergic vaginitis and then using a vaginal cream to treat the symptoms. Follow these instructions at home: Lifestyle Keep your genital area clean and dry. Avoid soap, and only rinse the area with water. Do not douche or use tampons until your health care provider says it is okay. Use sanitary pads, if needed. Do not have sex until your health care provider approves. When you can  return to sex, practice safe sex  and use condoms. Wipe from front to back. This avoids the spread of bacteria from the rectum to the vagina. General instructions Take over-the-counter and prescription medicines only as told by your health care provider. If you were prescribed an antibiotic medicine, take or use it as told by your health care provider. Do not stop taking or using the antibiotic even if you start to feel better. Keep all follow-up visits. This is important. How is this prevented? Use mild, unscented products. Do not use things that can irritate the vagina, such as fabric softeners. Avoid the following products if they are scented: Feminine sprays. Detergents. Tampons. Feminine hygiene products. Soaps or bubble baths. Let air reach your genital area. To do this: Wear cotton underwear to reduce moisture buildup. Avoid wearing underwear while you sleep. Avoid wearing tight pants and underwear or nylons without a cotton panel. Avoid wearing thong underwear. Take off any wet clothing, such as bathing suits, as soon as possible. Practice safe sex and use condoms. Contact a health care provider if: You have abdominal or pelvic pain. You have a fever or chills. You have symptoms that last for more than 2-3 days. Get help right away if: You have a fever and your symptoms suddenly get worse. Summary Vaginitis is a condition in which the vaginal tissue becomes inflamed.This condition is most often caused by a change in the normal balance of bacteria and yeast that live in the vagina. Treatment varies depending on the type of vaginitis you have. Do not douche, use tampons, or have sex until your health care provider approves. When you can return to sex, practice safe sex and use condoms. This information is not intended to replace advice given to you by your health care provider. Make sure you discuss any questions you have with your health care provider. Document Revised: 05/03/2020 Document Reviewed:  05/03/2020 Elsevier Patient Education  2023 [employer].

## 2022-06-18 LAB — CERVICOVAGINAL ANCILLARY ONLY
Bacterial Vaginitis (gardnerella): POSITIVE — AB
Candida Glabrata: NEGATIVE
Candida Vaginitis: NEGATIVE
Chlamydia: NEGATIVE
Comment: NEGATIVE
Comment: NEGATIVE
Comment: NEGATIVE
Comment: NEGATIVE
Comment: NEGATIVE
Comment: NORMAL
Neisseria Gonorrhea: NEGATIVE
Trichomonas: NEGATIVE

## 2022-06-18 MED ORDER — METRONIDAZOLE 0.75 % VA GEL: 1.0000 | Freq: Every day | VAGINAL | 0 refills | Status: DC

## 2022-06-18 NOTE — Addendum Note (Signed)
Addended by: Alysia Penna L on: 06/18/2022 01:40 PM   Modules accepted: Orders

## 2022-06-20 ENCOUNTER — Encounter: Payer: Self-pay | Admitting: Nurse Practitioner

## 2022-06-20 NOTE — Telephone Encounter (Signed)
2nd no show, no fee generated (due to [facility]), final warning letter sent

## 2022-06-30 ENCOUNTER — Encounter: Payer: Self-pay | Admitting: Nurse Practitioner

## 2022-06-30 ENCOUNTER — Ambulatory Visit (INDEPENDENT_AMBULATORY_CARE_PROVIDER_SITE_OTHER): Payer: Medicaid Other | Admitting: Nurse Practitioner

## 2022-06-30 VITALS — BP 112/70 | HR 64 | Temp 96.5°F | Ht 66.0 in | Wt 120.6 lb

## 2022-06-30 DIAGNOSIS — E559 Vitamin D deficiency, unspecified: Secondary | ICD-10-CM | POA: Diagnosis not present

## 2022-06-30 DIAGNOSIS — Z0001 Encounter for general adult medical examination with abnormal findings: Secondary | ICD-10-CM

## 2022-06-30 DIAGNOSIS — D500 Iron deficiency anemia secondary to blood loss (chronic): Secondary | ICD-10-CM

## 2022-06-30 LAB — BASIC METABOLIC PANEL
BUN: 9 mg/dL (ref 6–23)
CO2: 25 mEq/L (ref 19–32)
Calcium: 9.3 mg/dL (ref 8.4–10.5)
Chloride: 100 mEq/L (ref 96–112)
Creatinine, Ser: 0.7 mg/dL (ref 0.40–1.20)
GFR: 121.46 mL/min (ref >60.00)
Glucose, Bld: 76 mg/dL (ref 70–99)
Potassium: 3.6 mEq/L (ref 3.5–5.1)
Sodium: 134 mEq/L — ABNORMAL LOW (ref 135–145)

## 2022-06-30 LAB — CBC WITH DIFFERENTIAL/PLATELET
Basophils Absolute: 0 10*3/uL (ref 0.0–0.1)
Basophils Relative: 0.5 % (ref 0.0–3.0)
Eosinophils Absolute: 0 10*3/uL (ref 0.0–0.7)
Eosinophils Relative: 1.3 % (ref 0.0–5.0)
HCT: 39.7 % (ref 36.0–46.0)
Hemoglobin: 13.4 g/dL (ref 12.0–15.0)
Lymphocytes Relative: 36.2 % (ref 12.0–46.0)
Lymphs Abs: 1.1 10*3/uL (ref 0.7–4.0)
MCHC: 33.6 g/dL (ref 30.0–36.0)
MCV: 94 fl (ref 78.0–100.0)
Monocytes Absolute: 0.2 10*3/uL (ref 0.1–1.0)
Monocytes Relative: 6.8 % (ref 3.0–12.0)
Neutro Abs: 1.7 10*3/uL (ref 1.4–7.7)
Neutrophils Relative %: 55.2 % (ref 43.0–77.0)
Platelets: 200 10*3/uL (ref 150.0–400.0)
RBC: 4.23 Mil/uL (ref 3.87–5.11)
RDW: 15.1 % (ref 11.5–15.5)
WBC: 3.1 10*3/uL — ABNORMAL LOW (ref 4.0–10.5)

## 2022-06-30 LAB — TSH: TSH: 1.78 u[IU]/mL (ref 0.35–5.50)

## 2022-06-30 LAB — VITAMIN D 25 HYDROXY (VIT D DEFICIENCY, FRACTURES): VITD: 10.59 ng/mL — ABNORMAL LOW (ref 30.00–100.00)

## 2022-06-30 NOTE — Progress Notes (Signed)
Complete physical exam  Patient: Melissa Farmer   DOB: 12-01-1995   25 y.o. Female  MRN: [id number] Visit Date: 06/30/2022  Subjective:    Chief Complaint  Patient presents with   Annual Exam    CPE Pt fasting No concerns    Melissa Farmer is a 26 y.o. female who presents today for a complete physical exam. She reports consuming a general diet.  Walking 3x/week, 15-45mins each  She generally feels well. She reports sleeping fairly well. She does not have additional problems to discuss today.  Vision:No Dental:No STD Screen:No  Most recent fall risk assessment:    06/30/2022    8:27 AM  Fall Risk   Falls in the past year? 0  Number falls in past yr: 0  Injury with Fall? 0   Most recent depression screenings:    06/30/2022    9:29 AM 04/18/2022    8:51 AM  PHQ 2/9 Scores  PHQ - 2 Score 0 0  PHQ- 9 Score 0 3    HPI  No problem-specific Assessment & Plan notes found for this encounter.   Past Medical History:  Diagnosis Date   Anemia 2019   Anxiety 05/21   Medical history non-contributory    Past Surgical History:  Procedure Laterality Date   NO PAST SURGERIES     Social History   Socioeconomic History   Marital status: Significant Other    Spouse name: Not on file   Number of children: 2   Years of education: Not on file   Highest education level: Not on file  Occupational History   Not on file  Tobacco Use   Smoking status: Never   Smokeless tobacco: Never  Vaping Use   Vaping Use: Never used  Substance and Sexual Activity   Alcohol use: Yes    Alcohol/week: 3.0 standard drinks of alcohol    Types: 3 Shots of liquor per week   Drug use: No   Sexual activity: Yes    Birth control/protection: None  Other Topics Concern   Not on file  Social History Narrative   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   Social Determinants of Health   Financial Resource Strain: Low Risk  (09/21/2018)   Overall Financial Resource Strain  (CARDIA)    Difficulty of Paying Living Expenses: Not hard at all  Food Insecurity: No Food Insecurity (09/21/2018)   Hunger Vital Sign    Worried About Running Out of Food in the Last Year: Never true    Ran Out of Food in the Last Year: Never true  Transportation Needs: Unknown (09/21/2018)   PRAPARE - Administrator, Civil Service (Medical): No    Lack of Transportation (Non-Medical): Not on file  Physical Activity: Not on file  Stress: No Stress Concern Present (09/21/2018)   [employer] of Occupational Health - Occupational Stress Questionnaire    Feeling of Stress : Not at all  Social Connections: Not on file  Intimate Partner Violence: Not At Risk (09/21/2018)   Humiliation, Afraid, Rape, and Kick questionnaire    Fear of Current or Ex-Partner: No    Emotionally Abused: No    Physically Abused: No    Sexually Abused: No   Family Status  Relation Name Status   Mother  Alive   Father Will Alive   Pilar Plate (Not Specified)   MGM Cathleen Corti Alive   MGF  Alive   PGM [employer]  PGF  Deceased   Family History  Problem Relation Age of Onset   Asthma Father    Stroke Father    Diabetes Paternal Uncle    Kidney disease Maternal Grandmother    Asthma Maternal Grandmother    Stroke Maternal Grandfather    Diabetes Maternal Grandfather    Heart disease Maternal Grandfather    Cancer Paternal Grandmother 63       breast   Asthma Paternal Grandmother    No Known Allergies  Patient Care Team: Griffey Nicasio, Charlene Brooke, NP as PCP - General (Internal Medicine)   Medications: Outpatient Medications Prior to Visit  Medication Sig   ferrous sulfate 325 (65 FE) MG EC tablet Take 325 mg by mouth 3 (three) times daily with meals.   ibuprofen (ADVIL) 600 MG tablet Take 1 tablet (600 mg total) by mouth every 6 (six) hours as needed for moderate pain or cramping.   norethindrone (MICRONOR) 0.35 MG tablet Take 1 tablet (0.35 mg total) by mouth daily.   Prenatal  Vit-Fe Fumarate-FA (PRENATAL MULTIVITAMIN) TABS tablet Take 1 tablet by mouth daily at 12 noon.   ZAFEMY 150-35 MCG/24HR transdermal patch 1 patch once a week.   metroNIDAZOLE (METROGEL VAGINAL) 0.75 % vaginal gel Place 1 Applicatorful vaginally at bedtime. (Patient not taking: Reported on 06/30/2022)   No facility-administered medications prior to visit.    Review of Systems  Constitutional:  Negative for fever.  HENT:  Negative for congestion and sore throat.   Eyes:        Negative for visual changes  Respiratory:  Negative for cough and shortness of breath.   Cardiovascular:  Negative for chest pain, palpitations and leg swelling.  Gastrointestinal:  Negative for blood in stool, constipation and diarrhea.  Genitourinary:  Negative for dysuria, frequency and urgency.  Musculoskeletal:  Negative for myalgias.  Skin:  Negative for rash.  Neurological:  Negative for dizziness and headaches.  Hematological:  Does not bruise/bleed easily.  Psychiatric/Behavioral:  Negative for suicidal ideas. The patient is not nervous/anxious.         Objective:  BP 112/70 (BP Location: Right Arm, Patient Position: Sitting, Cuff Size: Small)   Pulse 64   Temp (!) 96.5 F (35.8 C) (Temporal)   Ht 5\' 6"  (1.676 m)   Wt 120 lb 9.6 oz (54.7 kg)   SpO2 99%   BMI 19.47 kg/m       Physical Exam Vitals reviewed.  Constitutional:      General: She is not in acute distress.    Appearance: She is well-developed.  HENT:     Right Ear: Tympanic membrane, ear canal and external ear normal.     Left Ear: Tympanic membrane, ear canal and external ear normal.     Nose: Nose normal.  Eyes:     Extraocular Movements: Extraocular movements intact.     Conjunctiva/sclera: Conjunctivae normal.  Cardiovascular:     Rate and Rhythm: Normal rate and regular rhythm.     Pulses: Normal pulses.     Heart sounds: Normal heart sounds.  Pulmonary:     Effort: Pulmonary effort is normal. No respiratory distress.      Breath sounds: Normal breath sounds.  Chest:     Chest wall: No tenderness.  Breasts:    Breasts are symmetrical.     Right: Normal.     Left: Normal.  Abdominal:     General: Bowel sounds are normal.     Palpations: Abdomen is soft.  Musculoskeletal:  General: Normal range of motion.     Cervical back: Normal range of motion and neck supple.     Right lower leg: No edema.     Left lower leg: No edema.  Lymphadenopathy:     Cervical: No cervical adenopathy.     Upper Body:     Right upper body: No supraclavicular, axillary or pectoral adenopathy.     Left upper body: No supraclavicular, axillary or pectoral adenopathy.  Skin:    General: Skin is warm and dry.  Neurological:     Mental Status: She is alert and oriented to person, place, and time.     Deep Tendon Reflexes: Reflexes are normal and symmetric.  Psychiatric:        Mood and Affect: Mood normal.        Behavior: Behavior normal.        Thought Content: Thought content normal.     No results found for any visits on 06/30/22.    Assessment & Plan:    Routine Health Maintenance and Physical Exam  Immunization History  Administered Date(s) Administered   HPV 9-valent 03/22/2021, 06/28/2021   Influenza,inj,Quad PF,6+ Mos 10/31/2014, 07/27/2018   PFIZER(Purple Top)SARS-COV-2 Vaccination 01/27/2020, 02/17/2020   Pfizer Covid-19 Vaccine Bivalent Booster 59yrs & up 04/10/2022   Tdap 10/31/2014, 06/28/2018, 01/29/2022    Health Maintenance  Topic Date Due   INFLUENZA VACCINE  06/17/2022   HPV VACCINES (3 - 3-dose series) 04/19/2023 (Originally 10/28/2021)   CHLAMYDIA SCREENING  06/18/2023   PAP-Cervical Cytology Screening  09/24/2024   PAP SMEAR-Modifier  09/24/2024   TETANUS/TDAP  01/30/2032   COVID-19 Vaccine  Completed   Hepatitis C Screening  Completed   HIV Screening  Completed   Discussed health benefits of physical activity, and encouraged her to engage in regular exercise appropriate for  her age and condition.  Problem List Items Addressed This Visit       Other   Iron deficiency anemia due to chronic blood loss   Relevant Orders   CBC with Differential/Platelet   Iron, TIBC and Ferritin Panel   Vitamin D insufficiency   Relevant Orders   VITAMIN D 25 Hydroxy (Vit-D Deficiency, Fractures)   Other Visit Diagnoses     Encounter for preventative adult health care exam with abnormal findings    -  Primary   Relevant Orders   Basic metabolic panel   TSH      Return in about 1 year (around 07/01/2023) for CPE (fasting).     Alysia Penna, NP

## 2022-06-30 NOTE — Patient Instructions (Signed)
Go to lab  Preventive Care 34-24 Years Old, Female Preventive care refers to lifestyle choices and visits with your health care provider that can promote health and wellness. Preventive care visits are also called wellness exams. What can I expect for my preventive care visit? Counseling During your preventive care visit, your health care provider may ask about your: Medical history, including: Past medical problems. Family medical history. Pregnancy history. Current health, including: Menstrual cycle. Method of birth control. Emotional well-being. Home life and relationship well-being. Sexual activity and sexual health. Lifestyle, including: Alcohol, nicotine or tobacco, and drug use. Access to firearms. Diet, exercise, and sleep habits. Work and work Statistician. Sunscreen use. Safety issues such as seatbelt and bike helmet use. Physical exam Your health care provider may check your: Height and weight. These may be used to calculate your BMI (body mass index). BMI is a measurement that tells if you are at a healthy weight. Waist circumference. This measures the distance around your waistline. This measurement also tells if you are at a healthy weight and may help predict your risk of certain diseases, such as type 2 diabetes and high blood pressure. Heart rate and blood pressure. Body temperature. Skin for abnormal spots. What immunizations do I need?  Vaccines are usually given at various ages, according to a schedule. Your health care provider will recommend vaccines for you based on your age, medical history, and lifestyle or other factors, such as travel or where you work. What tests do I need? Screening Your health care provider may recommend screening tests for certain conditions. This may include: Pelvic exam and Pap test. Lipid and cholesterol levels. Diabetes screening. This is done by checking your blood sugar (glucose) after you have not eaten for a while  (fasting). Hepatitis B test. Hepatitis C test. HIV (human immunodeficiency virus) test. STI (sexually transmitted infection) testing, if you are at risk. BRCA-related cancer screening. This may be done if you have a family history of breast, ovarian, tubal, or peritoneal cancers. Talk with your health care provider about your test results, treatment options, and if necessary, the need for more tests. Follow these instructions at home: Eating and drinking  Eat a healthy diet that includes fresh fruits and vegetables, whole grains, lean protein, and low-fat dairy products. Take vitamin and mineral supplements as recommended by your health care provider. Do not drink alcohol if: Your health care provider tells you not to drink. You are pregnant, may be pregnant, or are planning to become pregnant. If you drink alcohol: Limit how much you have to 0-1 drink a day. Know how much alcohol is in your drink. In the [place] one drink equals one 12 oz bottle of beer (355 mL), one 5 oz glass of wine (148 mL), or one 1 oz glass of hard liquor (44 mL). Lifestyle Brush your teeth every morning and night with fluoride toothpaste. Floss one time each day. Exercise for at least 30 minutes 5 or more days each week. Do not use any products that contain nicotine or tobacco. These products include cigarettes, chewing tobacco, and vaping devices, such as e-cigarettes. If you need help quitting, ask your health care provider. Do not use drugs. If you are sexually active, practice safe sex. Use a condom or other form of protection to prevent STIs. If you do not wish to become pregnant, use a form of birth control. If you plan to become pregnant, see your health care provider for a prepregnancy visit. Find healthy ways to manage  stress, such as: Meditation, yoga, or listening to music. Journaling. Talking to a trusted person. Spending time with friends and family. Minimize exposure to UV radiation to reduce your  risk of skin cancer. Safety Always wear your seat belt while driving or riding in a vehicle. Do not drive: If you have been drinking alcohol. Do not ride with someone who has been drinking. If you have been using any mind-altering substances or drugs. While texting. When you are tired or distracted. Wear a helmet and other protective equipment during sports activities. If you have firearms in your house, make sure you follow all gun safety procedures. Seek help if you have been physically or sexually abused. What's next? Go to your health care provider once a year for an annual wellness visit. Ask your health care provider how often you should have your eyes and teeth checked. Stay up to date on all vaccines. This information is not intended to replace advice given to you by your health care provider. Make sure you discuss any questions you have with your health care provider. Document Revised: 05/01/2021 Document Reviewed: 05/01/2021 Elsevier Patient Education  [place].

## 2022-07-01 LAB — IRON,TIBC AND FERRITIN PANEL
%SAT: 46 % (calc) — ABNORMAL HIGH (ref 16–45)
Ferritin: 56 ng/mL (ref 16–154)
Iron: 169 ug/dL (ref 40–190)
TIBC: 368 mcg/dL (calc) (ref 250–450)

## 2022-07-06 MED ORDER — VITAMIN D (ERGOCALCIFEROL) 1.25 MG (50000 UNIT) PO CAPS: 50000.0000 [IU] | ORAL_CAPSULE | ORAL | 0 refills | Status: DC

## 2022-07-06 NOTE — Addendum Note (Signed)
Addended by: Alysia Penna L on: 07/06/2022 11:32 AM   Modules accepted: Orders

## 2022-08-11 ENCOUNTER — Ambulatory Visit (INDEPENDENT_AMBULATORY_CARE_PROVIDER_SITE_OTHER): Payer: Medicaid Other | Admitting: Nurse Practitioner

## 2022-08-11 ENCOUNTER — Encounter: Payer: Self-pay | Admitting: Nurse Practitioner

## 2022-08-11 ENCOUNTER — Ambulatory Visit: Payer: Medicaid Other | Admitting: Nurse Practitioner

## 2022-08-11 VITALS — BP 116/80 | HR 85 | Temp 96.8°F | Ht 66.0 in | Wt 120.8 lb

## 2022-08-11 DIAGNOSIS — Z202 Contact with and (suspected) exposure to infections with a predominantly sexual mode of transmission: Secondary | ICD-10-CM
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
## 2022-08-11 NOTE — Patient Instructions (Addendum)
Go to lab  Safe Sex Practicing safe sex means taking steps before and during sex to reduce your risk of: Getting an STI (sexually transmitted infection). Giving your partner an STI. Unwanted or unplanned pregnancy. How to practice safe sex Ways you can practice safe sex  Limit your sexual partners to only one partner who is having sex with only you. Avoid using alcohol and drugs before having sex. Alcohol and drugs can affect your judgment. Before having sex with a new partner: Talk to your partner about past partners, past STIs, and drug use. Get screened for STIs and discuss the results with your partner. Ask your partner to get screened too. Check your body regularly for sores, blisters, rashes, or unusual discharge. If you notice any of these problems, visit your health care provider. Avoid sexual contact if you have symptoms of an infection or you are being treated for an STI. While having sex, use a condom. Make sure to: Use a condom every time you have vaginal, oral, or anal sex. Both females and males should wear condoms during oral sex. Keep condoms in place from the beginning to the end of sexual activity. Use a latex condom, if possible. Latex condoms offer the best protection. Use only water-based lubricants with a condom. Using petroleum-based lubricants or oils will weaken the condom and increase the chance that it will break. Ways your health care provider can help you practice safe sex  See your health care provider for regular screenings, exams, and tests for STIs. Talk with your health care provider about what kind of birth control (contraception) is best for you. Get vaccinated against hepatitis B and human papillomavirus (HPV). If you are at risk of being infected with HIV (human immunodeficiency virus), talk with your health care provider about taking a prescription medicine to prevent HIV infection. You are at risk for HIV if you: Are a man who has sex with other  men. Are sexually active with more than one partner. Take drugs by injection. Have a sex partner who has HIV. Have unprotected sex. Have sex with someone who has sex with both men and women. Have had an STI. Follow these instructions at home: Take over-the-counter and prescription medicines only as told by your health care provider. Keep all follow-up visits. This is important. Where to find more information Centers for Disease Control and Prevention: http://www.wolf.info/ Planned Parenthood: www.plannedparenthood.org Office on [employer] [facility]: [place] Summary Practicing safe sex means taking steps before and during sex to reduce your risk getting an STI, giving your partner an STI, and having an unwanted or unplanned pregnancy. Before having sex with a new partner, talk to your partner about past partners, past STIs, and drug use. Use a condom every time you have vaginal, oral, or anal sex. Both females and males should wear condoms during oral sex. Check your body regularly for sores, blisters, rashes, or unusual discharge. If you notice any of these problems, visit your health care provider. See your health care provider for regular screenings, exams, and tests for STIs. This information is not intended to replace advice given to you by your health care provider. Make sure you discuss any questions you have with your health care provider. Document Revised: 04/09/2020 Document Reviewed: 04/09/2020 Elsevier Patient Education  [place].

## 2022-08-12 LAB — CERVICOVAGINAL ANCILLARY ONLY
Chlamydia: NEGATIVE
Comment: NEGATIVE
Comment: NEGATIVE
Comment: NORMAL
Neisseria Gonorrhea: NEGATIVE
Trichomonas: NEGATIVE

## 2022-08-12 LAB — HIV ANTIBODY (ROUTINE TESTING W REFLEX): HIV 1&2 Ab, 4th Generation: NONREACTIVE

## 2022-08-12 LAB — HEPATITIS C ANTIBODY: Hepatitis C Ab: NONREACTIVE

## 2022-08-12 LAB — RPR: RPR Ser Ql: NONREACTIVE

## 2022-08-12 LAB — HEPATITIS B SURFACE ANTIGEN: Hepatitis B Surface Ag: NONREACTIVE

## 2022-08-26 ENCOUNTER — Ambulatory Visit (INDEPENDENT_AMBULATORY_CARE_PROVIDER_SITE_OTHER): Payer: Medicaid Other

## 2022-08-26 DIAGNOSIS — Z23 Encounter for immunization: Secondary | ICD-10-CM

## 2022-08-26 NOTE — Progress Notes (Unsigned)
Per orders of Wilfred Lacy NP pt is here for Influenza vaccine regular dose. Pt received influenza vaccine  in left deltoid at 11:00 am . Pt tolerated influenza vaccine well.
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
## 2022-09-09 ENCOUNTER — Ambulatory Visit (HOSPITAL_COMMUNITY)
Admission: RE | Admit: 2022-09-09 | Discharge: 2022-09-09 | Disposition: A | Discharge status: Home / Self Care | Payer: Medicaid Other | Source: Ambulatory Visit | Attending: Emergency Medicine | Admitting: Emergency Medicine

## 2022-09-09 ENCOUNTER — Encounter (HOSPITAL_COMMUNITY): Payer: Self-pay | Admitting: Emergency Medicine

## 2022-09-09 VITALS — BP 131/83 | HR 82 | Temp 98.5°F | Resp 17

## 2022-09-09 DIAGNOSIS — Z202 Contact with and (suspected) exposure to infections with a predominantly sexual mode of transmission: Secondary | ICD-10-CM | POA: Insufficient documentation

## 2022-09-09 DIAGNOSIS — N898 Other specified noninflammatory disorders of vagina: Secondary | ICD-10-CM | POA: Insufficient documentation

## 2022-09-09 NOTE — ED Triage Notes (Signed)
Pt reports vaginal discharge (grayish in color) and odor with irritation for about week. Uses condoms but wants STD testing.

## 2022-09-09 NOTE — Discharge Instructions (Addendum)
Cervical vaginal testing is in process. You test positive, you will be contacted for treatment.

## 2022-09-09 NOTE — ED Provider Notes (Signed)
Provider Note  Patient Contact: 3:33 PM (approximate)   History   appt 230 and Vaginal Discharge   HPI  Melissa Farmer is a 26 y.o. female presents to the urgent care with vaginal irritation.  Patient states that she has had sex with a new partner but states that she use protection.  She has had some increased vaginal discharge.  Denies dysuria, hematuria and increased urinary frequency.  She states that she has had some vaginal itching.  No flank pain, nausea, vomiting or possibility of pregnancy.      Physical Exam   Triage Vital Signs: ED Triage Vitals  Enc Vitals Group     BP 09/09/22 1455 131/83     Pulse Rate 09/09/22 1455 82     Resp 09/09/22 1455 17     Temp 09/09/22 1455 98.5 F (36.9 C)     Temp Source 09/09/22 1455 Oral     SpO2 09/09/22 1455 97 %     Weight --      Height --      Head Circumference --      Peak Flow --      Pain Score 09/09/22 1454 0     Pain Loc --      Pain Edu? --      Excl. in GC? --     Most recent vital signs: Vitals:   09/09/22 1455  BP: 131/83  Pulse: 82  Resp: 17  Temp: 98.5 F (36.9 C)  SpO2: 97%     General: Alert and in no acute distress. Eyes:  PERRL. EOMI Head: No acute traumatic findings ENT:      Nose: No congestion/rhinnorhea.      Mouth/Throat: Mucous membranes are moist.  Neck: No stridor. No cervical spine tenderness to palpation. Cardiovascular:  Good peripheral perfusion Respiratory: Normal respiratory effort without tachypnea or retractions. Lungs CTAB. Good air entry to the bases with no decreased or absent breath sounds. Gastrointestinal: Bowel sounds 4 quadrants. Soft and nontender to palpation. No guarding or rigidity. No palpable masses. No distention. No CVA tenderness. Musculoskeletal: Full range of motion to all extremities.  Neurologic:  No gross focal neurologic deficits are appreciated.  Skin:   No rash noted    ED Results / Procedures / Treatments   Labs (all labs ordered  are listed, but only abnormal results are displayed) Labs Reviewed  POC URINE PREG, ED  CERVICOVAGINAL ANCILLARY ONLY        PROCEDURES:  Critical Care performed: No  Procedures   MEDICATIONS ORDERED IN ED: Medications - No data to display   IMPRESSION / MDM / ASSESSMENT AND PLAN / ED COURSE  I reviewed the triage vital signs and the nursing notes.                              Assessment and plan Exposure to STD 26 year old female presents to the urgent care after possible exposure to STD primarily complaining of discharge and vaginal itching.  Vital signs are reassuring at triage.  On exam, patient was alert, active and nontoxic-appearing.  Patient adamantly denies possibility of pregnancy as she states that she has has a Nexplanon.  She would like to await cervical vaginal testing results before initiating any form of treatment.      FINAL CLINICAL IMPRESSION(S) / ED DIAGNOSES   Final diagnoses:  Possible exposure to STD  Vaginal irritation     Rx /  DC Orders   ED Discharge Orders     None        Note:  This document was prepared using Dragon voice recognition software and may include unintentional dictation errors.   Vallarie Mare [place], [place] 09/09/22 1536

## 2022-09-10 ENCOUNTER — Telehealth (HOSPITAL_COMMUNITY): Payer: Self-pay | Admitting: Emergency Medicine

## 2022-09-10 LAB — CERVICOVAGINAL ANCILLARY ONLY
Bacterial Vaginitis (gardnerella): POSITIVE — AB
Candida Glabrata: NEGATIVE
Candida Vaginitis: NEGATIVE
Chlamydia: NEGATIVE
Comment: NEGATIVE
Comment: NEGATIVE
Comment: NEGATIVE
Comment: NEGATIVE
Comment: NEGATIVE
Comment: NORMAL
Neisseria Gonorrhea: NEGATIVE
Trichomonas: NEGATIVE

## 2022-09-10 MED ORDER — METRONIDAZOLE 0.75 % VA GEL: 1.0000 | Freq: Every day | VAGINAL | 0 refills | Status: AC

## 2022-11-04 ENCOUNTER — Ambulatory Visit
Admission: EM | Admit: 2022-11-04 | Discharge: 2022-11-04 | Disposition: A | Discharge status: Home / Self Care | Payer: Medicaid Other | Attending: Urgent Care | Admitting: Urgent Care

## 2022-11-04 DIAGNOSIS — J3489 Other specified disorders of nose and nasal sinuses: Secondary | ICD-10-CM | POA: Diagnosis not present

## 2022-11-04 DIAGNOSIS — Z1152 Encounter for screening for COVID-19: Secondary | ICD-10-CM | POA: Diagnosis not present

## 2022-11-04 DIAGNOSIS — B349 Viral infection, unspecified: Secondary | ICD-10-CM | POA: Diagnosis not present

## 2022-11-04 DIAGNOSIS — J069 Acute upper respiratory infection, unspecified: Secondary | ICD-10-CM | POA: Insufficient documentation

## 2022-11-04 DIAGNOSIS — R058 Other specified cough: Secondary | ICD-10-CM | POA: Diagnosis not present

## 2022-11-04 LAB — RESP PANEL BY RT-PCR (FLU A&B, COVID) ARPGX2
Influenza A by PCR: NEGATIVE
Influenza B by PCR: NEGATIVE
SARS Coronavirus 2 by RT PCR: POSITIVE — AB

## 2022-11-04 MED ORDER — PROMETHAZINE-DM 6.25-15 MG/5ML PO SYRP
2.5000 mL | ORAL_SOLUTION | Freq: Three times a day (TID) | ORAL | 0 refills | Status: DC | PRN
Start: 2022-11-04 — End: 2022-11-19

## 2022-11-04 MED ORDER — PSEUDOEPHEDRINE HCL 60 MG PO TABS: 60.0000 mg | ORAL_TABLET | Freq: Three times a day (TID) | ORAL | 0 refills | Status: DC | PRN

## 2022-11-04 MED ORDER — CETIRIZINE HCL 10 MG PO TABS: 10.0000 mg | ORAL_TABLET | Freq: Every day | ORAL | 0 refills | Status: DC

## 2022-11-04 MED ORDER — BENZONATATE 100 MG PO CAPS: 100.0000 mg | ORAL_CAPSULE | Freq: Three times a day (TID) | ORAL | 0 refills | Status: DC | PRN

## 2022-11-04 NOTE — Discharge Instructions (Signed)

## 2022-11-04 NOTE — ED Triage Notes (Addendum)
Pt c/o cough, URI/face pain, back pain, chills-sx started yesterday-NAD-steady gait

## 2022-11-04 NOTE — ED Provider Notes (Signed)
[facility]  Note:  This document was prepared using Conservation officer, historic buildings and may include unintentional dictation errors.  MRN: [id number] DOB: [birth year]  Subjective:   Melissa Farmer is a 26 y.o. female presenting for 1 day history of acute onset body aches, cough that elicits chest pain, back pains. Has had sinus congestion, sinus pain, sinus drainage. Has had chills but no fevers. No asthma, smoking. 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   No current facility-administered medications for this encounter.  Current Outpatient Medications:    ferrous sulfate 325 (65 FE) MG EC tablet, Take 325 mg by mouth 3 (three) times daily with meals., Disp: , Rfl:    ibuprofen (ADVIL) 600 MG tablet, Take 1 tablet (600 mg total) by mouth every 6 (six) hours as needed for moderate pain or cramping., Disp: 40 tablet, Rfl: 1   norethindrone (MICRONOR) 0.35 MG tablet, Take 1 tablet (0.35 mg total) by mouth daily. (Patient not taking: Reported on 08/11/2022), Disp: 72 tablet, Rfl: 3   Prenatal Vit-Fe Fumarate-FA (PRENATAL MULTIVITAMIN) TABS tablet, Take 1 tablet by mouth daily at 12 noon., Disp: , Rfl:    Vitamin D, Ergocalciferol, (DRISDOL) 1.25 MG (50000 UNIT) CAPS capsule, Take 1 capsule (50,000 Units total) by mouth every 7 (seven) days., Disp: 12 capsule, Rfl: 0   ZAFEMY 150-35 MCG/24HR transdermal patch, 1 patch once a week., Disp: , Rfl:    No Known Allergies  Past Medical History:  Diagnosis Date   Anemia 2019   Anxiety 05/21   Encounter for initial prescription of contraceptive pills 06/30/2017   Medical history non-contributory      Past Surgical History:  Procedure Laterality Date   NO PAST SURGERIES      Family History  Problem Relation Age of Onset   Asthma Father    Stroke Father    Diabetes Paternal Uncle    Kidney disease Maternal Grandmother    Asthma Maternal Grandmother    Stroke Maternal Grandfather    Diabetes Maternal Grandfather     Heart disease Maternal Grandfather    Cancer Paternal Grandmother 57       breast   Asthma Paternal Grandmother     Social History   Tobacco Use   Smoking status: Never   Smokeless tobacco: Never  Vaping Use   Vaping Use: Never used  Substance Use Topics   Alcohol use: Yes    Alcohol/week: 3.0 standard drinks of alcohol    Types: 3 Shots of liquor per week    Comment: occ   Drug use: No    ROS   Objective:   Vitals: BP 124/84 (BP Location: Left Arm)   Pulse 95   Temp 99.4 F (37.4 C) (Oral)   Resp 17   LMP 10/28/2022 (Approximate)   SpO2 98%   Physical Exam Constitutional:      General: She is not in acute distress.    Appearance: Normal appearance. She is well-developed and normal weight. She is not ill-appearing, toxic-appearing or diaphoretic.  HENT:     Head: Normocephalic and atraumatic.     Right Ear: Tympanic membrane, ear canal and external ear normal. No drainage or tenderness. No middle ear effusion. There is no impacted cerumen. Tympanic membrane is not erythematous or bulging.     Left Ear: Tympanic membrane, ear canal and external ear normal. No drainage or tenderness.  No middle ear effusion. There is no impacted cerumen. Tympanic membrane is not erythematous or bulging.  Nose: Congestion present. No rhinorrhea.     Mouth/Throat:     Mouth: Mucous membranes are moist. No oral lesions.     Pharynx: No pharyngeal swelling, oropharyngeal exudate, posterior oropharyngeal erythema or uvula swelling.     Tonsils: No tonsillar exudate or tonsillar abscesses.  Eyes:     General: No scleral icterus.       Right eye: No discharge.        Left eye: No discharge.     Extraocular Movements: Extraocular movements intact.     Right eye: Normal extraocular motion.     Left eye: Normal extraocular motion.     Conjunctiva/sclera: Conjunctivae normal.  Cardiovascular:     Rate and Rhythm: Normal rate and regular rhythm.     Heart sounds: Normal heart sounds. No  murmur heard.    No friction rub. No gallop.  Pulmonary:     Effort: Pulmonary effort is normal. No respiratory distress.     Breath sounds: No stridor. No wheezing, rhonchi or rales.  Chest:     Chest wall: No tenderness.  Musculoskeletal:     Cervical back: Normal range of motion and neck supple.  Lymphadenopathy:     Cervical: No cervical adenopathy.  Skin:    General: Skin is warm and dry.  Neurological:     General: No focal deficit present.     Mental Status: She is alert and oriented to person, place, and time.  Psychiatric:        Mood and Affect: Mood normal.        Behavior: Behavior normal.        Thought Content: Thought content normal.        Judgment: Judgment normal.     Assessment and Plan :   PDMP not reviewed this encounter.  1. Acute viral syndrome     Deferred imaging given clear cardiopulmonary exam, hemodynamically stable vital signs. COVID and flu test pending.  We will otherwise manage for viral upper respiratory infection.  Physical exam findings reassuring and vital signs stable for discharge. Advised supportive care, offered symptomatic relief. Counseled patient on potential for adverse effects with medications prescribed/recommended today, ER and return-to-clinic precautions discussed, patient verbalized understanding.      Wallis Bamberg, [place] 11/04/22 1823

## 2022-11-06 ENCOUNTER — Encounter: Payer: Self-pay | Admitting: Nurse Practitioner

## 2022-11-11 ENCOUNTER — Ambulatory Visit: Admit: 2022-11-11 | Payer: Medicaid Other

## 2022-11-12 ENCOUNTER — Ambulatory Visit
Admission: RE | Admit: 2022-11-12 | Discharge: 2022-11-12 | Disposition: A | Discharge status: Home / Self Care | Payer: Medicaid Other | Source: Ambulatory Visit | Attending: Urgent Care | Admitting: Urgent Care

## 2022-11-12 VITALS — BP 124/71 | HR 56 | Temp 98.7°F | Resp 16

## 2022-11-12 DIAGNOSIS — B3731 Acute candidiasis of vulva and vagina: Secondary | ICD-10-CM | POA: Diagnosis not present

## 2022-11-12 DIAGNOSIS — B9689 Other specified bacterial agents as the cause of diseases classified elsewhere: Secondary | ICD-10-CM | POA: Insufficient documentation

## 2022-11-12 DIAGNOSIS — N760 Acute vaginitis: Secondary | ICD-10-CM | POA: Diagnosis present

## 2022-11-12 MED ORDER — METRONIDAZOLE 0.75 % VA GEL: 1.0000 | Freq: Every day | VAGINAL | 0 refills | Status: DC

## 2022-11-12 MED ORDER — FLUCONAZOLE 150 MG PO TABS: 150.0000 mg | ORAL_TABLET | ORAL | 0 refills | Status: DC

## 2022-11-12 NOTE — ED Provider Notes (Signed)
[facility]  Note:  This document was prepared using Conservation officer, historic buildings and may include unintentional dictation errors.  MRN: [id number] DOB: 19-Mar-1996  Subjective:   Melissa Farmer is a 26 y.o. female presenting for several day history of recurrent vaginal itching and excessive vaginal discharge.  Denies fever, n/v, abdominal pain, pelvic pain, rashes, dysuria, urinary frequency, hematuria.  Has a history of BV and yeast infections.  Would like to be checked for STIs as well.  No suspicion for pregnancy.  LMP was 10/28/2022.  No current facility-administered medications for this encounter.  Current Outpatient Medications:    benzonatate (TESSALON) 100 MG capsule, Take 1 capsule (100 mg total) by mouth 3 (three) times daily as needed for cough., Disp: 30 capsule, Rfl: 0   cetirizine (ZYRTEC ALLERGY) 10 MG tablet, Take 1 tablet (10 mg total) by mouth daily., Disp: 30 tablet, Rfl: 0   ferrous sulfate 325 (65 FE) MG EC tablet, Take 325 mg by mouth 3 (three) times daily with meals., Disp: , Rfl:    ibuprofen (ADVIL) 600 MG tablet, Take 1 tablet (600 mg total) by mouth every 6 (six) hours as needed for moderate pain or cramping., Disp: 40 tablet, Rfl: 1   norethindrone (MICRONOR) 0.35 MG tablet, Take 1 tablet (0.35 mg total) by mouth daily. (Patient not taking: Reported on 08/11/2022), Disp: 72 tablet, Rfl: 3   Prenatal Vit-Fe Fumarate-FA (PRENATAL MULTIVITAMIN) TABS tablet, Take 1 tablet by mouth daily at 12 noon., Disp: , Rfl:    promethazine-dextromethorphan (PROMETHAZINE-DM) 6.25-15 MG/5ML syrup, Take 2.5 mLs by mouth 3 (three) times daily as needed for cough., Disp: 100 mL, Rfl: 0   pseudoephedrine (SUDAFED) 60 MG tablet, Take 1 tablet (60 mg total) by mouth every 8 (eight) hours as needed for congestion., Disp: 30 tablet, Rfl: 0   Vitamin D, Ergocalciferol, (DRISDOL) 1.25 MG (50000 UNIT) CAPS capsule, Take 1 capsule (50,000 Units total) by mouth  every 7 (seven) days., Disp: 12 capsule, Rfl: 0   ZAFEMY 150-35 MCG/24HR transdermal patch, 1 patch once a week., Disp: , Rfl:    No Known Allergies  Past Medical History:  Diagnosis Date   Anemia 2019   Anxiety 05/21   Encounter for initial prescription of contraceptive pills 06/30/2017   Medical history non-contributory      Past Surgical History:  Procedure Laterality Date   NO PAST SURGERIES      Family History  Problem Relation Age of Onset   Asthma Father    Stroke Father    Diabetes Paternal Uncle    Kidney disease Maternal Grandmother    Asthma Maternal Grandmother    Stroke Maternal Grandfather    Diabetes Maternal Grandfather    Heart disease Maternal Grandfather    Cancer Paternal Grandmother 77       breast   Asthma Paternal Grandmother     Social History   Tobacco Use   Smoking status: Never   Smokeless tobacco: Never  Vaping Use   Vaping Use: Never used  Substance Use Topics   Alcohol use: Yes    Alcohol/week: 3.0 standard drinks of alcohol    Types: 3 Shots of liquor per week    Comment: occ   Drug use: No    ROS   Objective:   Vitals: BP 124/71 (BP Location: Left Arm)   Pulse (!) 56   Temp 98.7 F (37.1 C) (Oral)   Resp 16   LMP 10/28/2022 (Approximate)  SpO2 100%   Physical Exam Constitutional:      General: She is not in acute distress.    Appearance: Normal appearance. She is well-developed. She is not ill-appearing, toxic-appearing or diaphoretic.  HENT:     Head: Normocephalic and atraumatic.     Nose: Nose normal.     Mouth/Throat:     Mouth: Mucous membranes are moist.  Eyes:     General: No scleral icterus.       Right eye: No discharge.        Left eye: No discharge.     Extraocular Movements: Extraocular movements intact.     Conjunctiva/sclera: Conjunctivae normal.  Cardiovascular:     Rate and Rhythm: Normal rate.  Pulmonary:     Effort: Pulmonary effort is normal.  Abdominal:     General: Bowel sounds are  normal. There is no distension.     Palpations: Abdomen is soft. There is no mass.     Tenderness: There is no abdominal tenderness. There is no right CVA tenderness, left CVA tenderness, guarding or rebound.  Skin:    General: Skin is warm and dry.  Neurological:     General: No focal deficit present.     Mental Status: She is alert and oriented to person, place, and time.  Psychiatric:        Mood and Affect: Mood normal.        Behavior: Behavior normal.        Thought Content: Thought content normal.        Judgment: Judgment normal.     Assessment and Plan :   PDMP not reviewed this encounter.  1. Bacterial vaginosis   2. Yeast vaginitis     We will treat patient empirically for bacterial vaginosis with Flagyl and for yeast vaginitis with fluconazole.  Labs pending. Counseled patient on potential for adverse effects with medications prescribed/recommended today, ER and return-to-clinic precautions discussed, patient verbalized understanding.    Wallis Bamberg, PA-C 11/13/22 0800

## 2022-11-12 NOTE — ED Triage Notes (Signed)
Pt presents for odorless vaginal itching and discharge. Pt would like to be check for STD.

## 2022-11-14 LAB — CERVICOVAGINAL ANCILLARY ONLY
Bacterial Vaginitis (gardnerella): NEGATIVE
Candida Glabrata: NEGATIVE
Candida Vaginitis: POSITIVE — AB
Chlamydia: NEGATIVE
Comment: NEGATIVE
Comment: NEGATIVE
Comment: NEGATIVE
Comment: NEGATIVE
Comment: NEGATIVE
Comment: NORMAL
Neisseria Gonorrhea: NEGATIVE
Trichomonas: NEGATIVE

## 2022-11-19 ENCOUNTER — Ambulatory Visit (INDEPENDENT_AMBULATORY_CARE_PROVIDER_SITE_OTHER): Payer: Medicaid Other | Admitting: Nurse Practitioner

## 2022-11-19 ENCOUNTER — Encounter: Payer: Self-pay | Admitting: Nurse Practitioner

## 2022-11-19 VITALS — BP 100/60 | HR 68 | Temp 96.4°F | Ht 66.0 in | Wt 115.6 lb

## 2022-11-19 DIAGNOSIS — Z8719 Personal history of other diseases of the digestive system: Secondary | ICD-10-CM

## 2022-11-19 DIAGNOSIS — K529 Noninfective gastroenteritis and colitis, unspecified: Secondary | ICD-10-CM

## 2022-11-19 NOTE — Patient Instructions (Signed)
Viral Gastroenteritis, Adult  Viral gastroenteritis is also known as the stomach flu. This condition may affect your stomach, your small intestine, and your large intestine. It can cause sudden watery poop (diarrhea), fever, and vomiting. This condition is caused by certain germs (viruses). These germs can be passed from person to person very easily (are contagious). Having watery poop and vomiting can make you feel weak and cause you to not have enough water in your body (get dehydrated). This can make you tired and thirsty, make you have a dry mouth, and make it so you pee (urinate) less often. It is important to replace the fluids that you lose from having watery poop and vomiting. What are the causes? You can get sick by catching germs from other people. You can also get sick by: Eating food, drinking water, or touching a surface that has the germs on it (is contaminated). Sharing utensils or other personal items with a person who is sick. What increases the risk? Having a weak body defense system (immune system). Living with one or more children who are younger than 2 years. Living in a nursing home. Going on cruise ships. What are the signs or symptoms? Symptoms of this condition start suddenly. Symptoms may last for a few days or for as long as a week. Common symptoms include: Watery poop. Vomiting. Other symptoms include: Fever. Headache. Feeling tired (fatigue). Pain in the belly (abdomen). Chills. Feeling weak. Feeling like you may vomit (nauseous). Muscle aches. Not feeling hungry. How is this treated? This condition typically goes away on its own. The focus of treatment is to replace the fluids that you lose. This condition may be treated with: An ORS (oral rehydration solution). This is a drink that helps you replace fluids and minerals your body lost. It is sold at pharmacies and stores. Medicines to help with your symptoms. Probiotic supplements to reduce symptoms of  watery poop. Fluids given through an IV tube, if needed. Older adults and people with other diseases or a weak body defense system are at higher risk for not having enough water in the body. Follow these instructions at home: Eating and drinking  Take an ORS as told by your doctor. Drink clear fluids in small amounts as you are able. Clear fluids include: Water. Ice chips. Fruit juice that has water added to it (is diluted). Low-calorie sports drinks. Drink enough fluid to keep your pee (urine) pale yellow. Eat small amounts of healthy foods every 3-4 hours as you are able. This may include whole grains, fruits, vegetables, lean meats, and yogurt. Avoid fluids that have a lot of sugar or caffeine in them. This includes energy drinks, sports drinks, and soda. Avoid spicy or fatty foods. Avoid alcohol. General instructions  Wash your hands often. This is very important after you have watery poop or you vomit. If you cannot use soap and water, use hand sanitizer. Make sure that all people in your home wash their hands well and often. Take over-the-counter and prescription medicines only as told by your doctor. Rest at home while you get better. Watch your condition for any changes. Take a warm bath to help with any burning or pain from having watery poop. Keep all follow-up visits. Contact a doctor if: You cannot keep fluids down. Your symptoms get worse. You have new symptoms. You feel light-headed or dizzy. You have muscle cramps. Get help right away if: You have chest pain. You have trouble breathing, or you are breathing very fast.   You have a fast heartbeat. You feel very weak or you faint. You have a very bad headache, a stiff neck, or both. You have a rash. You have very bad pain, cramping, or bloating in your belly. Your skin feels cold and clammy. You feel mixed up (confused). You have pain when you pee. You have signs of not having enough water in the body, such  as: Dark pee, hardly any pee, or no pee. Cracked lips. Dry mouth. Sunken eyes. Feeling very sleepy. Feeling weak. You have signs of bleeding, such as: You see blood in your vomit. Your vomit looks like coffee grounds. You have bloody or black poop or poop that looks like tar. These symptoms may be an emergency. Get help right away. Call 911. Do not wait to see if the symptoms will go away. Do not drive yourself to the hospital. Summary Viral gastroenteritis is also known as the stomach flu. This condition can cause sudden watery poop (diarrhea), fever, and vomiting. These germs can be passed from person to person very easily. Take an ORS (oral rehydration solution) as told by your doctor. This is a drink that is sold at pharmacies and stores. Wash your hands often, especially after having watery poop or vomiting. If you cannot use soap and water, use hand sanitizer. This information is not intended to replace advice given to you by your health care provider. Make sure you discuss any questions you have with your health care provider. Document Revised: 09/02/2021 Document Reviewed: 09/02/2021 Elsevier Patient Education  2023 [employer] Inc.  

## 2022-11-19 NOTE — Progress Notes (Addendum)
Acute Office Visit  Subjective:    Patient ID: Melissa Farmer, female    DOB: Aug 14, 1996, 27 y.o.   MRN: [id number]  Chief Complaint  Patient presents with   Acute Visit    C/o loose stools & abd pain x 2 days  Feels better today    Diarrhea  This is a new problem. The current episode started in the past 7 days. The problem occurs 2 to 4 times per day. The problem has been resolved. The stool consistency is described as Watery. The patient states that diarrhea does not awaken her from sleep. Associated symptoms include abdominal pain. Pertinent negatives include no arthralgias, bloating, chills, coughing, fever, headaches, increased  flatus, myalgias, sweats, URI, vomiting or weight loss. Exacerbated by: fast food. She has tried increased fluids and change of diet for the symptoms. The treatment provided significant relief. There is no history of inflammatory bowel disease or irritable bowel syndrome.  Onset of symptoms after eating fastfood.  Outpatient Medications Prior to Visit  Medication Sig   benzonatate (TESSALON) 100 MG capsule Take 1 capsule (100 mg total) by mouth 3 (three) times daily as needed for cough.   cetirizine (ZYRTEC ALLERGY) 10 MG tablet Take 1 tablet (10 mg total) by mouth daily.   ferrous sulfate 325 (65 FE) MG EC tablet Take 325 mg by mouth 3 (three) times daily with meals.   ibuprofen (ADVIL) 600 MG tablet Take 1 tablet (600 mg total) by mouth every 6 (six) hours as needed for moderate pain or cramping.   Prenatal Vit-Fe Fumarate-FA (PRENATAL MULTIVITAMIN) TABS tablet Take 1 tablet by mouth daily at 12 noon.   Vitamin D, Ergocalciferol, (DRISDOL) 1.25 MG (50000 UNIT) CAPS capsule Take 1 capsule (50,000 Units total) by mouth every 7 (seven) days.   ZAFEMY 150-35 MCG/24HR transdermal patch 1 patch once a week.   [DISCONTINUED] fluconazole (DIFLUCAN) 150 MG tablet Take 1 tablet (150 mg total) by mouth once a week. (Patient not taking: Reported on 11/19/2022)    [DISCONTINUED] metroNIDAZOLE (METROGEL) 0.75 % vaginal gel Place 1 Applicatorful vaginally daily. Insert one applicator vaginally once daily. (Patient not taking: Reported on 11/19/2022)   [DISCONTINUED] norethindrone (MICRONOR) 0.35 MG tablet Take 1 tablet (0.35 mg total) by mouth daily. (Patient not taking: Reported on 08/11/2022)   [DISCONTINUED] promethazine-dextromethorphan (PROMETHAZINE-DM) 6.25-15 MG/5ML syrup Take 2.5 mLs by mouth 3 (three) times daily as needed for cough. (Patient not taking: Reported on 11/19/2022)   [DISCONTINUED] pseudoephedrine (SUDAFED) 60 MG tablet Take 1 tablet (60 mg total) by mouth every 8 (eight) hours as needed for congestion. (Patient not taking: Reported on 11/19/2022)   No facility-administered medications prior to visit.    Reviewed past medical and social history.  Review of Systems  Constitutional:  Negative for chills, fever and weight loss.  Respiratory:  Negative for cough.   Gastrointestinal:  Positive for abdominal pain and diarrhea. Negative for bloating, flatus and vomiting.  Musculoskeletal:  Negative for arthralgias and myalgias.  Neurological:  Negative for headaches.   Per HPI     Objective:    Physical Exam Vitals reviewed.  Cardiovascular:     Rate and Rhythm: Normal rate.     Pulses: Normal pulses.  Pulmonary:     Effort: Pulmonary effort is normal.  Abdominal:     General: Bowel sounds are normal.     Palpations: Abdomen is soft.  Neurological:     Mental Status: She is alert and oriented to person, place, and time.  BP 100/60 (BP Location: Left Arm, Patient Position: Sitting, Cuff Size: Small)   Pulse 68   Temp (!) 96.4 F (35.8 C) (Temporal)   Ht 5\' 6"  (1.676 m)   Wt 115 lb 9.6 oz (52.4 kg)   LMP 10/28/2022 (Approximate)   SpO2 98%   BMI 18.66 kg/m   No results found for any visits on 11/19/22.     Assessment & Plan:   Problem List Items Addressed This Visit   None Visit Diagnoses     Gastroenteritis    -   Primary     Since symptoms are resolved, no additional recommendation at this time.  No orders of the defined types were placed in this encounter.  Return in about 7 months (around 06/20/2023) for CPE (fasting).    Wilfred Lacy, NP

## 2023-01-09 ENCOUNTER — Ambulatory Visit (HOSPITAL_COMMUNITY): Payer: Medicaid Other

## 2023-02-04 ENCOUNTER — Ambulatory Visit: Payer: Medicaid Other

## 2023-02-10 ENCOUNTER — Emergency Department (HOSPITAL_COMMUNITY)
Admission: EM | Admit: 2023-02-10 | Discharge: 2023-02-10 | Disposition: A | Discharge status: Home / Self Care | Payer: Medicaid Other | Attending: Emergency Medicine | Admitting: Emergency Medicine

## 2023-02-10 ENCOUNTER — Other Ambulatory Visit: Payer: Self-pay

## 2023-02-10 ENCOUNTER — Emergency Department (HOSPITAL_COMMUNITY): Payer: Medicaid Other

## 2023-02-10 ENCOUNTER — Telehealth: Payer: Self-pay | Admitting: Nurse Practitioner

## 2023-02-10 ENCOUNTER — Encounter (HOSPITAL_COMMUNITY): Payer: Self-pay | Admitting: Emergency Medicine

## 2023-02-10 DIAGNOSIS — N644 Mastodynia: Secondary | ICD-10-CM | POA: Insufficient documentation

## 2023-02-10 LAB — BASIC METABOLIC PANEL
Anion gap: 9 (ref 5–15)
BUN: 8 mg/dL (ref 6–20)
CO2: 23 mmol/L (ref 22–32)
Calcium: 9 mg/dL (ref 8.9–10.3)
Chloride: 103 mmol/L (ref 98–111)
Creatinine, Ser: 0.7 mg/dL (ref 0.44–1.00)
GFR, Estimated: >60 mL/min (ref >60)
Glucose, Bld: 92 mg/dL (ref 70–99)
Potassium: 3.7 mmol/L (ref 3.5–5.1)
Sodium: 135 mmol/L (ref 135–145)

## 2023-02-10 LAB — CBC
HCT: 35.8 % — ABNORMAL LOW (ref 36.0–46.0)
Hemoglobin: 12.3 g/dL (ref 12.0–15.0)
MCH: 31.9 pg (ref 26.0–34.0)
MCHC: 34.4 g/dL (ref 30.0–36.0)
MCV: 92.7 fL (ref 80.0–100.0)
Platelets: 196 10*3/uL (ref 150–400)
RBC: 3.86 MIL/uL — ABNORMAL LOW (ref 3.87–5.11)
RDW: 12 % (ref 11.5–15.5)
WBC: 3.1 10*3/uL — ABNORMAL LOW (ref 4.0–10.5)
nRBC: 0 % (ref 0.0–0.2)

## 2023-02-10 LAB — TROPONIN I (HIGH SENSITIVITY): Troponin I (High Sensitivity): <2 ng/L (ref <18)

## 2023-02-10 NOTE — ED Provider Notes (Signed)
[facility] Provider Note   CSN: [id number] Arrival date & time: 02/10/23  0945     History  Chief Complaint  Patient presents with   Chest Pain    Melissa Farmer is a 27 y.o. female.  The history is provided by the patient and medical records. No language interpreter was used.  Chest Pain    27 year old female significant history of anemia, anxiety, presenting with complaints of chest pain.  Patient report for the past 2 days she has noticed pain primarily to her right breast.  She described pain as a tenderness sensation to her upper breast worse when she palpates it and sometimes when she takes a deep breath.  2 days she noticed pain presents even while she was laying still.  Pain is not associate with lightheadedness, dizziness, nausea, diaphoresis, or shortness of breath.  No fever chills productive cough no complaint of any nodules or skin changes.  She denies tobacco use, does drink alcohol socially.  No strong family history of premature cardiac death.  She denies any specific treatment tried.  Last menstrual period was approximately a month ago.  Home Medications Prior to Admission medications   Medication Sig Start Date End Date Taking? Authorizing Provider  benzonatate (TESSALON) 100 MG capsule Take 1 capsule (100 mg total) by mouth 3 (three) times daily as needed for cough. 11/04/22   Jaynee Eagles, PA-C  cetirizine (ZYRTEC ALLERGY) 10 MG tablet Take 1 tablet (10 mg total) by mouth daily. 11/04/22   Jaynee Eagles, PA-C  ferrous sulfate 325 (65 FE) MG EC tablet Take 325 mg by mouth 3 (three) times daily with meals.    [provider]  ibuprofen (ADVIL) 600 MG tablet Take 1 tablet (600 mg total) by mouth every 6 (six) hours as needed for moderate pain or cramping. 04/10/22   Banga, Bonnee Quin, DO  Prenatal Vit-Fe Fumarate-FA (PRENATAL MULTIVITAMIN) TABS tablet Take 1 tablet by mouth daily at 12 noon. 04/18/22   Nche,  Charlene Brooke, NP  Vitamin D, Ergocalciferol, (DRISDOL) 1.25 MG (50000 UNIT) CAPS capsule Take 1 capsule (50,000 Units total) by mouth every 7 (seven) days. 07/06/22   Nche, Charlene Brooke, NP  ZAFEMY 150-35 MCG/24HR transdermal patch 1 patch once a week. 05/12/22   [provider]      Allergies    Patient has no known allergies.    Review of Systems   Review of Systems  Cardiovascular:  Positive for chest pain.  All other systems reviewed and are negative.   Physical Exam Updated Vital Signs BP 130/71 (BP Location: Left Arm)   Pulse 85   Temp 98.3 F (36.8 C) (Oral)   Resp 14   SpO2 100%  Physical Exam Vitals and nursing note reviewed.  Constitutional:      General: She is not in acute distress.    Appearance: She is well-developed.  HENT:     Head: Atraumatic.  Eyes:     Conjunctiva/sclera: Conjunctivae normal.  Cardiovascular:     Rate and Rhythm: Normal rate and regular rhythm.     Pulses: Normal pulses.     Heart sounds: Normal heart sounds.  Pulmonary:     Effort: Pulmonary effort is normal.  Chest:     Chest wall: Tenderness (Elexes, RN served as Producer, television/film/video.  Right breast with tenderness noted to upper breast without any overlying skin changes, no peau d'orange,  no palpable mass and no evidence of cellulitis.) present.  Abdominal:     Palpations: Abdomen is soft.     Tenderness: There is no abdominal tenderness.  Musculoskeletal:     Cervical back: Neck supple.  Skin:    Findings: No rash.  Neurological:     Mental Status: She is alert.  Psychiatric:        Mood and Affect: Mood normal.     ED Results / Procedures / Treatments   Labs (all labs ordered are listed, but only abnormal results are displayed) Labs Reviewed  CBC - Abnormal; Notable for the following components:      Result Value   WBC 3.1 (*)    RBC 3.86 (*)    HCT 35.8 (*)    All other components within normal limits  BASIC METABOLIC PANEL  TROPONIN I (HIGH SENSITIVITY)   TROPONIN I (HIGH SENSITIVITY)    EKG EKG Interpretation  Date/Time:  Tuesday February 10 2023 09:53:47 EDT Ventricular Rate:  71 PR Interval:  143 QRS Duration: 103 QT Interval:  398 QTC Calculation: 433 R Axis:   47 Text Interpretation: Sinus rhythm Confirmed by Lennice Sites ([id number]) on 02/10/2023 9:56:13 AM  Radiology DG Chest Portable 1 View  Result Date: 02/10/2023 CLINICAL DATA:  Chest pain EXAM: PORTABLE CHEST 1 VIEW COMPARISON:  06/10/2021 FINDINGS: The heart size and mediastinal contours are within normal limits. Both lungs are clear. The visualized skeletal structures are unremarkable. IMPRESSION: No acute abnormality of the lungs in AP portable projection. Electronically Signed   By: Delanna Ahmadi M.D.   On: 02/10/2023 10:49    Procedures Procedures    Medications Ordered in ED Medications - No data to display  ED Course/ Medical Decision Making/ A&P                             Medical Decision Making Amount and/or Complexity of Data Reviewed Labs: ordered. Radiology: ordered.   BP 130/71 (BP Location: Left Arm)   Pulse 85   Temp 98.3 F (36.8 C) (Oral)   Resp 14   SpO2 100%   1:5 AM   27 year old female significant history of anemia, anxiety, presenting with complaints of chest pain.  Patient report for the past 2 days she has noticed pain primarily to her right breast.  She described pain as a tenderness sensation to her upper breast worse when she palpates it and sometimes when she takes a deep breath.  2 days she noticed pain presents even while she was laying still.  Pain is not associate with lightheadedness, dizziness, nausea, diaphoresis, or shortness of breath.  No fever chills productive cough no complaint of any nodules or skin changes.  She denies tobacco use, does drink alcohol socially.  No strong family history of premature cardiac death.  She denies any specific treatment tried.  Last menstrual period was approximately a month ago.  No prior  history of PE or DVT no recent surgery prolonged bedrest active cancer hemoptysis leg swelling or calf pain.  No oral birth control.  On exam this is a well-appearing female resting comfortably in bed appears to be in no acute discomfort.  Heart with normal rate and rhythm, lungs clear to auscultation bilaterally abdomen is soft nontender.  Chaperone present during exam of her right breast.  She does have some reproducible tenderness noted to the upper portion of her breast without any overlying skin changes no palpable mass no signs concerning for cellulitis or abscess. Vital signs reviewed  and overall reassuring.  -Labs ordered, independently viewed and interpreted by me.  Labs remarkable for result at baseline and reassuring -The patient was maintained on a cardiac monitor.  I personally viewed and interpreted the cardiac monitored which showed an underlying rhythm of: NSR -Imaging independently viewed and interpreted by me and I agree with radiologist's interpretation.  Result remarkable for CXR without acute finding -This patient presents to the ED for concern of breast pain, this involves an extensive number of treatment options, and is a complaint that carries with it a high risk of complications and morbidity.  The differential diagnosis includes cystic lesion, fibroadenanoma, cellulitis, MSK pain, mastitis, ACS, PE, abscess, costochondritis -Co morbidities that complicate the patient evaluation includes none -Treatment includes none -Reevaluation of the patient after these medicines showed that the patient stayed the same -PCP office notes or outside notes reviewed -Escalation to admission/observation considered: patients feels much better, is comfortable with discharge, and will follow up with PCP -Prescription medication considered, patient comfortable with OTC medication and f/u at the breast clinic for breast US -Social Determinant of Health considered   11:25 AM EKG, x-rays, labs are  overall reassuring.  At this time I have low suspicion for cardiac cause.  Patient is PERC negative, doubt PE.  No appreciable breast mass however due to having tenderness, will give referral to the breast center for outpatient breast ultrasound for further assessment.  Recommend patient to take over-the-counter medication as needed for aches and pain and gave patient return precaution.            Final Clinical Impression(s) / ED Diagnoses Final diagnoses:  Breast pain, right    Rx / DC Orders ED Discharge Orders          Ordered    US BREAST COMPLETE UNI RIGHT INC AXILLA        02/10/23 1128              Domenic Moras, PA-C 02/10/23 1133    [place], [place], DO 02/10/23 1241

## 2023-02-10 NOTE — Telephone Encounter (Signed)
Caller Name: Reesa Chew Ph #: [phone] Chief Complaint: Chest pain per mychart appt, called pt and she notes taking shower yesterday and has soreness in chest, hurts to touch  This call was transferred to [place] with Nurse Triage/Access Nurse. This is for documentation purposes. No follow up required at this time.

## 2023-02-10 NOTE — Discharge Instructions (Addendum)
You have been evaluated for your symptoms.  Fortunately your EKG and your chest x-ray and your labs are overall reassuring.  Please call and follow-up with the breast center for a right breast ultrasound outpatient for further evaluation.  Otherwise follow-up closely with your doctor for further care.  You may take over-the-counter Tylenol or ibuprofen as needed for pain.  Return to the ER if your symptoms worsen or if you have other concern.

## 2023-02-10 NOTE — ED Triage Notes (Signed)
Pt states she has been having chest pain since yesterday. It has worsened today. Pain is on the right side of her chest and sore to palpation. Denies N/V

## 2023-02-11 ENCOUNTER — Telehealth: Payer: Self-pay | Admitting: Nurse Practitioner

## 2023-02-11 ENCOUNTER — Ambulatory Visit: Payer: Medicaid Other | Admitting: Nurse Practitioner

## 2023-02-11 NOTE — Telephone Encounter (Signed)
Pt was seen in ER per nurse triage, appt cancelled/no fee

## 2023-02-11 NOTE — Telephone Encounter (Signed)
Pt cancelled at 8:20 for her 9:00. She said she was seen yesterday and did not need this appt. She was not seen here.

## 2023-02-19 ENCOUNTER — Inpatient Hospital Stay: Admission: RE | Admit: 2023-02-19 | Payer: Medicaid Other | Source: Ambulatory Visit

## 2023-04-15 ENCOUNTER — Encounter: Payer: Self-pay | Admitting: Nurse Practitioner

## 2023-04-16 ENCOUNTER — Other Ambulatory Visit: Payer: Self-pay

## 2023-05-07 ENCOUNTER — Ambulatory Visit
Admission: RE | Admit: 2023-05-07 | Discharge: 2023-05-07 | Disposition: A | Discharge status: Home / Self Care | Payer: Medicaid Other | Source: Ambulatory Visit | Attending: Nurse Practitioner | Admitting: Nurse Practitioner

## 2023-05-07 VITALS — BP 122/69 | HR 81 | Temp 98.0°F | Resp 16

## 2023-05-07 DIAGNOSIS — N760 Acute vaginitis: Secondary | ICD-10-CM | POA: Diagnosis present

## 2023-05-07 MED ORDER — FLUCONAZOLE 150 MG PO TABS: 150.0000 mg | ORAL_TABLET | Freq: Every day | ORAL | 0 refills | Status: AC

## 2023-05-07 NOTE — ED Triage Notes (Signed)
Pt presents to UC w/ c/o vaginal itching, discharge x3 days. Hx yeast infections.

## 2023-05-07 NOTE — ED Provider Notes (Signed)
[facility]    CSN: [id number] Arrival date & time: 05/07/23  1738      History   Chief Complaint Chief Complaint  Patient presents with   Vaginal Itching    Itchy yeast type want std testing as well - Entered by patient    HPI Melissa Farmer is a 27 y.o. female Melissa Farmer for evaluation of vaginal discharge.  Patient reports 3 days of a pruritic white thick vaginal discharge.  Denies any odor, dysuria, fevers, nausea/vomiting, flank pain.  No STD exposure but she would like screening.  She does have a history of yeast and BV and states these current symptoms are consistent with previous yeast infections.  No recent antibiotic usage.  She has not used any OTC medications for the symptoms.  No other concerns at this time.   Vaginal Itching    Past Medical History:  Diagnosis Date   Anemia 2019   Anxiety 05/21   Encounter for initial prescription of contraceptive pills 06/30/2017   Medical history non-contributory     Patient Active Problem List   Diagnosis Date Noted   Indication for care in labor or delivery 04/09/2022   Iron deficiency anemia due to chronic blood loss 06/26/2020   Vitamin D insufficiency 06/26/2020   Closed nondisplaced fracture of styloid process of left ulna 06/20/2020   Anxiety 05/23/2020   Left wrist tendonitis 05/22/2020   Encounter for initial prescription of contraceptive pills 06/30/2017   Headache 03/26/2016    Past Surgical History:  Procedure Laterality Date   NO PAST SURGERIES      OB History     Gravida  2   Para  2   Term  2   Preterm      AB      Living  2      SAB      IAB      Ectopic      Multiple  0   Live Births  2            Home Medications    Prior to Admission medications   Medication Sig Start Date End Date Taking? Authorizing Provider  fluconazole (DIFLUCAN) 150 MG tablet Take 1 tablet (150 mg total) by mouth daily for 2 doses. Take 1 tablet today and you may repeat in 3 days  if symptoms persist 05/07/23 05/09/23 Yes Radford Pax, NP  benzonatate (TESSALON) 100 MG capsule Take 1 capsule (100 mg total) by mouth 3 (three) times daily as needed for cough. 11/04/22   Wallis Bamberg, PA-C  cetirizine (ZYRTEC ALLERGY) 10 MG tablet Take 1 tablet (10 mg total) by mouth daily. 11/04/22   Wallis Bamberg, PA-C  ferrous sulfate 325 (65 FE) MG EC tablet Take 325 mg by mouth 3 (three) times daily with meals.    [provider]  ibuprofen (ADVIL) 600 MG tablet Take 1 tablet (600 mg total) by mouth every 6 (six) hours as needed for moderate pain or cramping. 04/10/22   Banga, Sharol Given, DO  Prenatal Vit-Fe Fumarate-FA (PRENATAL MULTIVITAMIN) TABS tablet Take 1 tablet by mouth daily at 12 noon. 04/18/22   Nche, Bonna Gains, NP  Vitamin D, Ergocalciferol, (DRISDOL) 1.25 MG (50000 UNIT) CAPS capsule Take 1 capsule (50,000 Units total) by mouth every 7 (seven) days. 07/06/22   Nche, Bonna Gains, NP  ZAFEMY 150-35 MCG/24HR transdermal patch 1 patch once a week. 05/12/22   [provider]    Family History Family History  Problem Relation Age of Onset   Asthma Father    Stroke Father    Diabetes Paternal Uncle    Kidney disease Maternal Grandmother    Asthma Maternal Grandmother    Stroke Maternal Grandfather    Diabetes Maternal Grandfather    Heart disease Maternal Grandfather    Cancer Paternal Grandmother 39       breast   Asthma Paternal Grandmother     Social History Social History   Tobacco Use   Smoking status: Never   Smokeless tobacco: Never  Vaping Use   Vaping Use: Never used  Substance Use Topics   Alcohol use: Yes    Alcohol/week: 3.0 standard drinks of alcohol    Types: 3 Shots of liquor per week    Comment: occ   Drug use: No     Allergies   Patient has no known allergies.   Review of Systems Review of Systems  Genitourinary:  Positive for vaginal discharge.     Physical Exam Triage Vital Signs ED Triage Vitals  Enc Vitals  Group     BP 05/07/23 1802 122/69     Pulse Rate 05/07/23 1802 81     Resp 05/07/23 1802 16     Temp 05/07/23 1802 98 F (36.7 C)     Temp Source 05/07/23 1802 Oral     SpO2 05/07/23 1802 99 %     Weight --      Height --      Head Circumference --      Peak Flow --      Pain Score 05/07/23 1806 0     Pain Loc --      Pain Edu? --      Excl. in GC? --    No data found.  Updated Vital Signs BP 122/69 (BP Location: Right Arm)   Pulse 81   Temp 98 F (36.7 C) (Oral)   Resp 16   LMP 04/13/2023 (Approximate)   SpO2 99%   Visual Acuity Right Eye Distance:   Left Eye Distance:   Bilateral Distance:    Right Eye Near:   Left Eye Near:    Bilateral Near:     Physical Exam Vitals and nursing note reviewed.  Constitutional:      Appearance: Normal appearance.  HENT:     Head: Normocephalic and atraumatic.  Eyes:     Pupils: Pupils are equal, round, and reactive to light.  Cardiovascular:     Rate and Rhythm: Normal rate.  Pulmonary:     Effort: Pulmonary effort is normal.  Abdominal:     Tenderness: There is no right CVA tenderness or left CVA tenderness.  Skin:    General: Skin is warm and dry.  Neurological:     General: No focal deficit present.     Mental Status: She is alert and oriented to person, place, and time.  Psychiatric:        Mood and Affect: Mood normal.        Behavior: Behavior normal.      UC Treatments / Results  Labs (all labs ordered are listed, but only abnormal results are displayed) Labs Reviewed  CERVICOVAGINAL ANCILLARY ONLY    EKG   Radiology No results found.  Procedures Procedures (including critical care time)  Medications Ordered in UC Medications - No data to display  Initial Impression / Assessment and Plan / UC Course  I have reviewed the triage vital signs and the nursing notes.  Pertinent labs & imaging results that were available during my care of the patient were reviewed by me and considered in my  medical decision making (see chart for details).     Reviewed symptoms with patient.  Will start Diflucan for yeast infection.  Vaginal swab as ordered and will contact for any positive results PCP or GYN follow-up if symptoms do not improve ER precautions reviewed Final Clinical Impressions(s) / UC Diagnoses   Final diagnoses:  Acute vaginitis     Discharge Instructions      Diflucan as prescribed The clinic will contact you with results of the vaginal swab done today if positive Follow-up with your PCP if your symptoms or not improving Please go to the ER for any worsening symptoms Hope you feel better soon!   ED Prescriptions     Medication Sig Dispense Auth. Provider   fluconazole (DIFLUCAN) 150 MG tablet Take 1 tablet (150 mg total) by mouth daily for 2 doses. Take 1 tablet today and you may repeat in 3 days if symptoms persist 2 tablet Radford Pax, NP      PDMP not reviewed this encounter.   Radford Pax, NP 05/07/23 [phone]

## 2023-05-07 NOTE — Discharge Instructions (Signed)
Diflucan as prescribed The clinic will contact you with results of the vaginal swab done today if positive Follow-up with your PCP if your symptoms or not improving Please go to the ER for any worsening symptoms Hope you feel better soon!

## 2023-05-08 LAB — CERVICOVAGINAL ANCILLARY ONLY
Bacterial Vaginitis (gardnerella): NEGATIVE
Candida Glabrata: NEGATIVE
Candida Vaginitis: POSITIVE — AB
Chlamydia: NEGATIVE
Comment: NEGATIVE
Comment: NEGATIVE
Comment: NEGATIVE
Comment: NEGATIVE
Comment: NEGATIVE
Comment: NORMAL
Neisseria Gonorrhea: NEGATIVE
Trichomonas: NEGATIVE

## 2023-05-19 LAB — HM PAP SMEAR: HM Pap smear: NORMAL

## 2023-05-22 ENCOUNTER — Encounter: Payer: Self-pay | Admitting: Nurse Practitioner

## 2023-06-23 ENCOUNTER — Ambulatory Visit: Payer: Medicaid Other | Admitting: Nurse Practitioner

## 2023-06-23 ENCOUNTER — Other Ambulatory Visit (HOSPITAL_COMMUNITY)
Admission: RE | Admit: 2023-06-23 | Discharge: 2023-06-23 | Disposition: A | Discharge status: Home / Self Care | Payer: Medicaid Other | Source: Ambulatory Visit | Attending: Nurse Practitioner | Admitting: Nurse Practitioner

## 2023-06-23 VITALS — BP 90/62 | HR 55 | Temp 98.0°F | Resp 16 | Ht 66.0 in | Wt 117.0 lb

## 2023-06-23 DIAGNOSIS — N921 Excessive and frequent menstruation with irregular cycle: Secondary | ICD-10-CM | POA: Insufficient documentation

## 2023-06-23 LAB — POCT URINE PREGNANCY: Preg Test, Ur: NEGATIVE

## 2023-06-23 NOTE — Patient Instructions (Signed)
Resume oral iron supplement, take with food and vit. C Resume Vit. D 5000IU daily OVER THE COUNTER dose F/up with GYN if vaginal persist more than 2weeks

## 2023-06-23 NOTE — Assessment & Plan Note (Signed)
Noticed prolonged cycle:7/22-7/28 and 8/4-current, associated with mild Cramps and small clots. No pain with intercourse, No dysuria, no vaginal odor or discharge or lesion Current use of Hormonal transdermal patch x 1year prescribed by [facility].  Negative urine pregnancy Wet prep collected Advised to f/up with GYN

## 2023-06-23 NOTE — Progress Notes (Signed)
Established Patient Visit  Patient: Melissa Farmer   DOB: 07-09-1996   27 y.o. Female  MRN: [id number] Visit Date: 06/23/2023  Subjective:    Chief Complaint  Patient presents with   Menstrual Problem    My periods start, end and return a week later    HPI Excessive menstruation with irregular cycle Noticed prolonged cycle:7/22-7/28 and 8/4-current, associated with mild Cramps and small clots. No pain with intercourse, No dysuria, no vaginal odor or discharge or lesion Current use of Hormonal transdermal patch x 1year prescribed by [facility].  Negative urine pregnancy Wet prep collected Advised to f/up with GYN  Reviewed medical, surgical, and social history today  Medications: Outpatient Medications Prior to Visit  Medication Sig   cetirizine (ZYRTEC ALLERGY) 10 MG tablet Take 1 tablet (10 mg total) by mouth daily.   ferrous sulfate 325 (65 FE) MG EC tablet Take 325 mg by mouth 3 (three) times daily with meals.   ibuprofen (ADVIL) 600 MG tablet Take 1 tablet (600 mg total) by mouth every 6 (six) hours as needed for moderate pain or cramping.   Vitamin D, Ergocalciferol, (DRISDOL) 1.25 MG (50000 UNIT) CAPS capsule Take 1 capsule (50,000 Units total) by mouth every 7 (seven) days.   ZAFEMY 150-35 MCG/24HR transdermal patch 1 patch once a week.   [DISCONTINUED] benzonatate (TESSALON) 100 MG capsule Take 1 capsule (100 mg total) by mouth 3 (three) times daily as needed for cough. (Patient not taking: Reported on 06/23/2023)   [DISCONTINUED] Prenatal Vit-Fe Fumarate-FA (PRENATAL MULTIVITAMIN) TABS tablet Take 1 tablet by mouth daily at 12 noon. (Patient not taking: Reported on 06/23/2023)   No facility-administered medications prior to visit.   Reviewed past medical and social history.   ROS per HPI above      Objective:  BP 90/62 (BP Location: Left Arm, Patient Position: Sitting, Cuff Size: Large)   Pulse (!) 55   Temp 98 F (36.7 C)   Resp 16   Ht 5'  6" (1.676 m)   Wt 117 lb (53.1 kg)   LMP 06/21/2023 (Exact Date)   SpO2 99%   Breastfeeding No   BMI 18.88 kg/m      Physical Exam Vitals and nursing note reviewed.  Abdominal:     General: There is no distension.     Palpations: Abdomen is soft.     Tenderness: There is no abdominal tenderness. There is no right CVA tenderness or left CVA tenderness.  Genitourinary:    Comments: Opted for vaginal self swab Neurological:     Mental Status: She is oriented to person, place, and time.  Psychiatric:        Mood and Affect: Mood normal.        Behavior: Behavior normal.     Results for orders placed or performed in visit on 06/23/23  POCT urine pregnancy  Result Value Ref Range   Preg Test, Ur Negative Negative      Assessment & Plan:    Problem List Items Addressed This Visit       Other   Excessive menstruation with irregular cycle - Primary    Noticed prolonged cycle:7/22-7/28 and 8/4-current, associated with mild Cramps and small clots. No pain with intercourse, No dysuria, no vaginal odor or discharge or lesion Current use of Hormonal transdermal patch x 1year prescribed by [facility].  Negative urine pregnancy Wet prep collected Advised to f/up  with GYN      Relevant Orders   POCT urine pregnancy (Completed)   Cervicovaginal ancillary only( [facility])   Return in about 4 weeks (around 07/21/2023) for CPE (fasting).     Alysia Penna, NP

## 2023-06-24 ENCOUNTER — Encounter: Payer: Self-pay | Admitting: Nurse Practitioner

## 2023-06-25 ENCOUNTER — Other Ambulatory Visit: Payer: Self-pay | Admitting: Nurse Practitioner

## 2023-06-25 DIAGNOSIS — N760 Acute vaginitis: Secondary | ICD-10-CM

## 2023-06-25 MED ORDER — METRONIDAZOLE 0.75 % VA GEL: 1.0000 | Freq: Every day | VAGINAL | 0 refills | Status: DC

## 2023-07-23 ENCOUNTER — Ambulatory Visit
Admission: RE | Admit: 2023-07-23 | Discharge: 2023-07-23 | Disposition: A | Discharge status: Home / Self Care | Payer: Medicaid Other | Source: Ambulatory Visit | Attending: Internal Medicine | Admitting: Internal Medicine

## 2023-07-23 VITALS — BP 124/77 | HR 61 | Temp 99.0°F | Resp 18

## 2023-07-23 DIAGNOSIS — B9689 Other specified bacterial agents as the cause of diseases classified elsewhere: Secondary | ICD-10-CM | POA: Diagnosis present

## 2023-07-23 DIAGNOSIS — N760 Acute vaginitis: Secondary | ICD-10-CM

## 2023-07-23 LAB — POCT URINE PREGNANCY: Preg Test, Ur: NEGATIVE

## 2023-07-23 MED ORDER — FLUCONAZOLE 150 MG PO TABS: 150.0000 mg | ORAL_TABLET | ORAL | 0 refills | Status: DC

## 2023-07-23 MED ORDER — METRONIDAZOLE 500 MG PO TABS: 500.0000 mg | ORAL_TABLET | Freq: Two times a day (BID) | ORAL | 0 refills | Status: DC

## 2023-07-23 NOTE — ED Provider Notes (Addendum)
[facility]  Note:  This document was prepared using Conservation officer, historic buildings and may include unintentional dictation errors.  MRN: [id number] DOB: 12-Feb-1996  Subjective:   Melissa Farmer is a 27 y.o. female presenting for 1 day history of recurrent malodorous thick vaginal discharge.  Has a history of BV and yeast infection.  LMP was 1 month ago.  Would like STI testing.  No urinary symptoms.  No current facility-administered medications for this encounter.  Current Outpatient Medications:    cetirizine (ZYRTEC ALLERGY) 10 MG tablet, Take 1 tablet (10 mg total) by mouth daily., Disp: 30 tablet, Rfl: 0   ferrous sulfate 325 (65 FE) MG EC tablet, Take 325 mg by mouth 3 (three) times daily with meals., Disp: , Rfl:    ibuprofen (ADVIL) 600 MG tablet, Take 1 tablet (600 mg total) by mouth every 6 (six) hours as needed for moderate pain or cramping., Disp: 40 tablet, Rfl: 1   metroNIDAZOLE (METROGEL) 0.75 % vaginal gel, Place 1 Applicatorful vaginally at bedtime., Disp: 70 g, Rfl: 0   Vitamin D, Ergocalciferol, (DRISDOL) 1.25 MG (50000 UNIT) CAPS capsule, Take 1 capsule (50,000 Units total) by mouth every 7 (seven) days., Disp: 12 capsule, Rfl: 0   ZAFEMY 150-35 MCG/24HR transdermal patch, 1 patch once a week., Disp: , Rfl:    No Known Allergies  Past Medical History:  Diagnosis Date   Anemia 2019   Anxiety 05/21   Encounter for initial prescription of contraceptive pills 06/30/2017   Medical history non-contributory      Past Surgical History:  Procedure Laterality Date   NO PAST SURGERIES      Family History  Problem Relation Age of Onset   Asthma Father    Stroke Father    Diabetes Paternal Uncle    Kidney disease Maternal Grandmother    Asthma Maternal Grandmother    Stroke Maternal Grandfather    Diabetes Maternal Grandfather    Heart disease Maternal Grandfather    Cancer Paternal Grandmother 61       breast   Asthma Paternal  Grandmother     Social History   Tobacco Use   Smoking status: Never   Smokeless tobacco: Never  Vaping Use   Vaping status: Never Used  Substance Use Topics   Alcohol use: Yes    Alcohol/week: 3.0 standard drinks of alcohol    Types: 3 Shots of liquor per week    Comment: occ   Drug use: No    ROS   Objective:   Vitals: BP 124/77 (BP Location: Left Arm)   Pulse 61   Temp 99 F (37.2 C) (Oral)   Resp 18   LMP 06/21/2023 (Exact Date)   SpO2 99%   Physical Exam Constitutional:      General: She is not in acute distress.    Appearance: Normal appearance. She is well-developed. She is not ill-appearing, toxic-appearing or diaphoretic.  HENT:     Head: Normocephalic and atraumatic.     Nose: Nose normal.     Mouth/Throat:     Mouth: Mucous membranes are moist.  Eyes:     General: No scleral icterus.       Right eye: No discharge.        Left eye: No discharge.     Extraocular Movements: Extraocular movements intact.     Conjunctiva/sclera: Conjunctivae normal.  Cardiovascular:     Rate and Rhythm: Normal rate.  Pulmonary:  Effort: Pulmonary effort is normal.  Abdominal:     General: Bowel sounds are normal. There is no distension.     Palpations: Abdomen is soft. There is no mass.     Tenderness: There is no abdominal tenderness. There is no right CVA tenderness, left CVA tenderness, guarding or rebound.  Skin:    General: Skin is warm and dry.  Neurological:     General: No focal deficit present.     Mental Status: She is alert and oriented to person, place, and time.  Psychiatric:        Mood and Affect: Mood normal.        Behavior: Behavior normal.        Thought Content: Thought content normal.        Judgment: Judgment normal.    Results for orders placed or performed during the hospital encounter of 07/23/23 (from the past 24 hour(s))  POCT urine pregnancy     Status: None   Collection Time: 07/23/23  6:35 PM  Result Value Ref Range   Preg  Test, Ur Negative Negative     Assessment and Plan :   PDMP not reviewed this encounter.  1. Bacterial vaginosis   2. Acute vaginitis    We will treat patient empirically for bacterial vaginosis with oral Flagyl and for yeast vaginitis with fluconazole.  Labs pending. Follow up with gynecology. Counseled patient on potential for adverse effects with medications prescribed/recommended today, ER and return-to-clinic precautions discussed, patient verbalized understanding.      Wallis Bamberg, [place] 07/23/23 2952

## 2023-07-23 NOTE — ED Triage Notes (Signed)
Pt presents to UC w/ c/o foul smelling vaginal discharge for one day. Pt states it's like "when I had BV in the past." Pt requests std testing as well.

## 2023-07-23 NOTE — Discharge Instructions (Signed)
Please start Flagyl and Diflucan to address a BV infection and yeast infection. Make sure you hydrate very well with plain water and a quantity of 64 ounces of water a day.  Please limit drinks that are considered urinary irritants such as soda, sweet tea, coffee, energy drinks, alcohol.  These can worsen your urinary and genital symptoms but also be the source of them.  I will let you know about your results through MyChart to see if we need to prescribe or change your antibiotics based off of those results.

## 2023-07-24 LAB — CERVICOVAGINAL ANCILLARY ONLY
Bacterial Vaginitis (gardnerella): POSITIVE — AB
Candida Glabrata: NEGATIVE
Candida Vaginitis: NEGATIVE
Chlamydia: NEGATIVE
Comment: NEGATIVE
Comment: NEGATIVE
Comment: NEGATIVE
Comment: NEGATIVE
Comment: NEGATIVE
Comment: NORMAL
Neisseria Gonorrhea: NEGATIVE
Trichomonas: NEGATIVE

## 2023-07-27 ENCOUNTER — Ambulatory Visit (INDEPENDENT_AMBULATORY_CARE_PROVIDER_SITE_OTHER): Payer: Medicaid Other | Admitting: Nurse Practitioner

## 2023-07-27 ENCOUNTER — Encounter: Payer: Self-pay | Admitting: Nurse Practitioner

## 2023-07-27 VITALS — BP 108/70 | HR 68 | Temp 98.0°F | Ht 66.0 in | Wt 115.4 lb

## 2023-07-27 DIAGNOSIS — E559 Vitamin D deficiency, unspecified: Secondary | ICD-10-CM | POA: Diagnosis not present

## 2023-07-27 DIAGNOSIS — D500 Iron deficiency anemia secondary to blood loss (chronic): Secondary | ICD-10-CM | POA: Diagnosis not present

## 2023-07-27 DIAGNOSIS — Z1159 Encounter for screening for other viral diseases: Secondary | ICD-10-CM | POA: Diagnosis not present

## 2023-07-27 DIAGNOSIS — Z0000 Encounter for general adult medical examination without abnormal findings: Secondary | ICD-10-CM

## 2023-07-27 DIAGNOSIS — Z0001 Encounter for general adult medical examination with abnormal findings: Secondary | ICD-10-CM

## 2023-07-27 LAB — CBC WITH DIFFERENTIAL/PLATELET
Basophils Absolute: 0 10*3/uL (ref 0.0–0.1)
Basophils Relative: 0.6 % (ref 0.0–3.0)
Eosinophils Absolute: 0 10*3/uL (ref 0.0–0.7)
Eosinophils Relative: 1 % (ref 0.0–5.0)
HCT: 37.4 % (ref 36.0–46.0)
Hemoglobin: 12.6 g/dL (ref 12.0–15.0)
Lymphocytes Relative: 29.2 % (ref 12.0–46.0)
Lymphs Abs: 1.4 10*3/uL (ref 0.7–4.0)
MCHC: 33.7 g/dL (ref 30.0–36.0)
MCV: 96.1 fl (ref 78.0–100.0)
Monocytes Absolute: 0.3 10*3/uL (ref 0.1–1.0)
Monocytes Relative: 5.4 % (ref 3.0–12.0)
Neutro Abs: 3.1 10*3/uL (ref 1.4–7.7)
Neutrophils Relative %: 63.8 % (ref 43.0–77.0)
Platelets: 227 10*3/uL (ref 150.0–400.0)
RBC: 3.89 Mil/uL (ref 3.87–5.11)
RDW: 12.6 % (ref 11.5–15.5)
WBC: 4.9 10*3/uL (ref 4.0–10.5)

## 2023-07-27 LAB — TSH: TSH: 1.54 u[IU]/mL (ref 0.35–5.50)

## 2023-07-27 LAB — VITAMIN D 25 HYDROXY (VIT D DEFICIENCY, FRACTURES): VITD: 10.89 ng/mL — ABNORMAL LOW (ref 30.00–100.00)

## 2023-07-27 NOTE — Patient Instructions (Signed)
Go to lab Continue Heart healthy diet and daily exercise.  Preventive Care 21-25 Years Old, Female Preventive care refers to lifestyle choices and visits with your health care provider that can promote health and wellness. Preventive care visits are also called wellness exams. What can I expect for my preventive care visit? Counseling During your preventive care visit, your health care provider may ask about your: Medical history, including: Past medical problems. Family medical history. Pregnancy history. Current health, including: Menstrual cycle. Method of birth control. Emotional well-being. Home life and relationship well-being. Sexual activity and sexual health. Lifestyle, including: Alcohol, nicotine or tobacco, and drug use. Access to firearms. Diet, exercise, and sleep habits. Work and work environment. Sunscreen use. Safety issues such as seatbelt and bike helmet use. Physical exam Your health care provider may check your: Height and weight. These may be used to calculate your BMI (body mass index). BMI is a measurement that tells if you are at a healthy weight. Waist circumference. This measures the distance around your waistline. This measurement also tells if you are at a healthy weight and may help predict your risk of certain diseases, such as type 2 diabetes and high blood pressure. Heart rate and blood pressure. Body temperature. Skin for abnormal spots. What immunizations do I need?  Vaccines are usually given at various ages, according to a schedule. Your health care provider will recommend vaccines for you based on your age, medical history, and lifestyle or other factors, such as travel or where you work. What tests do I need? Screening Your health care provider may recommend screening tests for certain conditions. This may include: Pelvic exam and Pap test. Lipid and cholesterol levels. Diabetes screening. This is done by checking your blood sugar  (glucose) after you have not eaten for a while (fasting). Hepatitis B test. Hepatitis C test. HIV (human immunodeficiency virus) test. STI (sexually transmitted infection) testing, if you are at risk. BRCA-related cancer screening. This may be done if you have a family history of breast, ovarian, tubal, or peritoneal cancers. Talk with your health care provider about your test results, treatment options, and if necessary, the need for more tests. Follow these instructions at home: Eating and drinking  Eat a healthy diet that includes fresh fruits and vegetables, whole grains, lean protein, and low-fat dairy products. Take vitamin and mineral supplements as recommended by your health care provider. Do not drink alcohol if: Your health care provider tells you not to drink. You are pregnant, may be pregnant, or are planning to become pregnant. If you drink alcohol: Limit how much you have to 0-1 drink a day. Know how much alcohol is in your drink. In the [place] one drink equals one 12 oz bottle of beer (355 mL), one 5 oz glass of wine (148 mL), or one 1 oz glass of hard liquor (44 mL). Lifestyle Brush your teeth every morning and night with fluoride toothpaste. Floss one time each day. Exercise for at least 30 minutes 5 or more days each week. Do not use any products that contain nicotine or tobacco. These products include cigarettes, chewing tobacco, and vaping devices, such as e-cigarettes. If you need help quitting, ask your health care provider. Do not use drugs. If you are sexually active, practice safe sex. Use a condom or other form of protection to prevent STIs. If you do not wish to become pregnant, use a form of birth control. If you plan to become pregnant, see your health care provider for a   prepregnancy visit. Find healthy ways to manage stress, such as: Meditation, yoga, or listening to music. Journaling. Talking to a trusted person. Spending time with friends and  family. Minimize exposure to UV radiation to reduce your risk of skin cancer. Safety Always wear your seat belt while driving or riding in a vehicle. Do not drive: If you have been drinking alcohol. Do not ride with someone who has been drinking. If you have been using any mind-altering substances or drugs. While texting. When you are tired or distracted. Wear a helmet and other protective equipment during sports activities. If you have firearms in your house, make sure you follow all gun safety procedures. Seek help if you have been physically or sexually abused. What's next? Go to your health care provider once a year for an annual wellness visit. Ask your health care provider how often you should have your eyes and teeth checked. Stay up to date on all vaccines. This information is not intended to replace advice given to you by your health care provider. Make sure you discuss any questions you have with your health care provider. Document Revised: 05/01/2021 Document Reviewed: 05/01/2021 Elsevier Patient Education  2024 Elsevier Inc.  

## 2023-07-27 NOTE — Assessment & Plan Note (Signed)
Repeat CBC and iron panel Current use of transdermal patch, managed by GYN

## 2023-07-27 NOTE — Progress Notes (Signed)
Complete physical exam  Patient: Melissa Farmer   DOB: 02/26/1996   27 y.o. Female  MRN: [id number] Visit Date: 07/27/2023  Subjective:    Chief Complaint  Patient presents with   STI/STD concerns     Would like blood work for STI/STD   Annual Exam    fasting   Melissa Farmer is a 27 y.o. female who presents today for a complete physical exam. She reports consuming a general diet.  No regular exercise regimen  She generally feels well. She reports sleeping well. She does have additional problems to discuss today.  Vision:Yes Dental:No STD Screen:Yes  BP Readings from Last 3 Encounters:  07/27/23 108/70  07/23/23 124/77  06/23/23 90/62   Wt Readings from Last 3 Encounters:  07/27/23 115 lb 6.4 oz (52.3 kg)  06/23/23 117 lb (53.1 kg)  11/19/22 115 lb 9.6 oz (52.4 kg)   Most recent fall risk assessment:    06/23/2023    8:14 AM  Fall Risk   Falls in the past year? 0  Number falls in past yr: 0  Injury with Fall? 0  Risk for fall due to : History of fall(s)  Follow up Falls evaluation completed   Depression screen:Yes - Depression  Most recent depression screenings:    06/23/2023    8:14 AM 06/30/2022    9:29 AM  PHQ 2/9 Scores  PHQ - 2 Score 0 0  PHQ- 9 Score  0   HPI  Iron deficiency anemia due to chronic blood loss Repeat CBC and iron panel Current use of transdermal patch, managed by GYN  Vitamin D deficiency Repeat vit. D  Past Medical History:  Diagnosis Date   Anemia 2019   Anxiety 05/21   Encounter for initial prescription of contraceptive pills 06/30/2017   Medical history non-contributory    Past Surgical History:  Procedure Laterality Date   NO PAST SURGERIES     Social History   Socioeconomic History   Marital status: Significant Other    Spouse name: Not on file   Number of children: 2   Years of education: Not on file   Highest education level: Some college, no degree  Occupational History   Not on file  Tobacco Use    Smoking status: Never   Smokeless tobacco: Never  Vaping Use   Vaping status: Never Used  Substance and Sexual Activity   Alcohol use: Yes    Alcohol/week: 3.0 standard drinks of alcohol    Types: 3 Shots of liquor per week    Comment: occ   Drug use: No   Sexual activity: Yes    Birth control/protection: Patch  Other Topics Concern   Not on file  Social History Narrative   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   Social Determinants of Health   Financial Resource Strain: Low Risk  (06/23/2023)   Overall Financial Resource Strain (CARDIA)    Difficulty of Paying Living Expenses: Not hard at all  Food Insecurity: No Food Insecurity (06/23/2023)   Hunger Vital Sign    Worried About Running Out of Food in the Last Year: Never true    Ran Out of Food in the Last Year: Never true  Transportation Needs: No Transportation Needs (06/23/2023)   PRAPARE - Administrator, Civil Service (Medical): No    Lack of Transportation (Non-Medical): No  Physical Activity: Insufficiently Active (06/23/2023)   Exercise Vital Sign    Days of Exercise  per Week: 1 day    Minutes of Exercise per Session: 30 min  Stress: No Stress Concern Present (06/23/2023)   [employer] of Occupational Health - Occupational Stress Questionnaire    Feeling of Stress : Only a little  Social Connections: Moderately Isolated (06/23/2023)   Social Connection and Isolation Panel [NHANES]    Frequency of Communication with Friends and Family: More than three times a week    Frequency of Social Gatherings with Friends and Family: More than three times a week    Attends Religious Services: More than 4 times per year    Active Member of [employer] or Organizations: No    Attends Engineer, structural: Not on file    Marital Status: Never married  Intimate Partner Violence: Not At Risk (09/21/2018)   Humiliation, Afraid, Rape, and Kick questionnaire    Fear of Current or Ex-Partner: No     Emotionally Abused: No    Physically Abused: No    Sexually Abused: No   Family Status  Relation Name Status   Mother  Alive   Father Will Alive   Pilar Plate (Not Specified)   MGM Babara Alive   MGF  Alive   PGM Britta Mccreedy Alive   PGF  Deceased  No partnership data on file   Family History  Problem Relation Age of Onset   Asthma Father    Stroke Father    Diabetes Paternal Uncle    Kidney disease Maternal Grandmother    Asthma Maternal Grandmother    Stroke Maternal Grandfather    Diabetes Maternal Grandfather    Heart disease Maternal Grandfather    Cancer Paternal Grandmother 27       breast   Asthma Paternal Grandmother    No Known Allergies  Patient Care Team: Eunice Winecoff, Bonna Gains, NP as PCP - General (Internal Medicine) Philip Aspen, DO as Consulting Physician (Obstetrics and Gynecology)   Medications: Outpatient Medications Prior to Visit  Medication Sig   cetirizine (ZYRTEC ALLERGY) 10 MG tablet Take 1 tablet (10 mg total) by mouth daily.   ferrous sulfate 325 (65 FE) MG EC tablet Take 325 mg by mouth 3 (three) times daily with meals.   fluconazole (DIFLUCAN) 150 MG tablet Take 1 tablet (150 mg total) by mouth once a week.   ibuprofen (ADVIL) 600 MG tablet Take 1 tablet (600 mg total) by mouth every 6 (six) hours as needed for moderate pain or cramping.   metroNIDAZOLE (FLAGYL) 500 MG tablet Take 1 tablet (500 mg total) by mouth 2 (two) times daily.   Vitamin D, Ergocalciferol, (DRISDOL) 1.25 MG (50000 UNIT) CAPS capsule Take 1 capsule (50,000 Units total) by mouth every 7 (seven) days.   ZAFEMY 150-35 MCG/24HR transdermal patch 1 patch once a week.   No facility-administered medications prior to visit.    Review of Systems  Constitutional:  Negative for activity change, appetite change and unexpected weight change.  Respiratory: Negative.    Cardiovascular: Negative.   Gastrointestinal: Negative.   Endocrine: Negative for cold intolerance and heat  intolerance.  Genitourinary: Negative.   Musculoskeletal: Negative.   Skin: Negative.   Neurological: Negative.   Hematological: Negative.   Psychiatric/Behavioral:  Negative for behavioral problems, decreased concentration, dysphoric mood, hallucinations, self-injury, sleep disturbance and suicidal ideas. The patient is nervous/anxious.         Objective:  BP 108/70   Pulse 68   Temp 98 F (36.7 C) (Temporal)   Ht 5\' 6"  (1.676 m)  Wt 115 lb 6.4 oz (52.3 kg)   LMP 07/07/2023   SpO2 97%   BMI 18.63 kg/m     Physical Exam Vitals and nursing note reviewed.  Constitutional:      General: She is not in acute distress. HENT:     Right Ear: Tympanic membrane, ear canal and external ear normal.     Left Ear: Tympanic membrane, ear canal and external ear normal.     Nose: Nose normal.  Eyes:     Extraocular Movements: Extraocular movements intact.     Conjunctiva/sclera: Conjunctivae normal.     Pupils: Pupils are equal, round, and reactive to light.  Neck:     Thyroid: No thyroid mass, thyromegaly or thyroid tenderness.  Cardiovascular:     Rate and Rhythm: Normal rate and regular rhythm.     Pulses: Normal pulses.     Heart sounds: Normal heart sounds.  Pulmonary:     Effort: Pulmonary effort is normal.     Breath sounds: Normal breath sounds.  Abdominal:     General: Bowel sounds are normal.     Palpations: Abdomen is soft.  Musculoskeletal:        General: Normal range of motion.     Cervical back: Normal range of motion and neck supple.     Right lower leg: No edema.     Left lower leg: No edema.  Lymphadenopathy:     Cervical: No cervical adenopathy.  Skin:    General: Skin is warm and dry.  Neurological:     Mental Status: She is alert and oriented to person, place, and time.     Cranial Nerves: No cranial nerve deficit.  Psychiatric:        Mood and Affect: Mood normal.        Behavior: Behavior normal.        Thought Content: Thought content normal.      No results found for any visits on 07/27/23.    Assessment & Plan:    Routine Health Maintenance and Physical Exam  Immunization History  Administered Date(s) Administered   HPV 9-valent 03/22/2021, 06/28/2021   Influenza,inj,Quad PF,6+ Mos 10/31/2014, 07/27/2018, 08/26/2022   PFIZER(Purple Top)SARS-COV-2 Vaccination 01/27/2020, 02/17/2020   Pfizer Covid-19 Vaccine Bivalent Booster 47yrs & up 04/10/2022   Tdap 10/31/2014, 06/28/2018, 01/29/2022   Health Maintenance  Topic Date Due   HPV VACCINES (3 - 3-dose series) 11/17/2023 (Originally 09/22/2021)   COVID-19 Vaccine (4 - 2023-24 season) 11/17/2023 (Originally 07/19/2023)   INFLUENZA VACCINE  02/15/2024 (Originally 06/18/2023)   PAP-Cervical Cytology Screening  05/18/2026   PAP SMEAR-Modifier  05/18/2026   DTaP/Tdap/Td (4 - Td or Tdap) 01/30/2032   Hepatitis C Screening  Completed   HIV Screening  Completed   Discussed health benefits of physical activity, and encouraged her to engage in regular exercise appropriate for her age and condition.  Problem List Items Addressed This Visit     Iron deficiency anemia due to chronic blood loss    Repeat CBC and iron panel Current use of transdermal patch, managed by GYN      Relevant Orders   Iron, TIBC and Ferritin Panel   CBC with Differential/Platelet   Vitamin D deficiency    Repeat vit. D      Relevant Orders   VITAMIN D 25 Hydroxy (Vit-D Deficiency, Fractures)   Other Visit Diagnoses     Encounter for preventative adult health care exam with abnormal findings    -  Primary  Relevant Orders   TSH   Encounter for screening for viral disease       Relevant Orders   RPR   HIV Antibody (routine testing w rflx)   Hepatitis C antibody   Hep B Core Ab W/Reflex      Return in about 1 year (around 07/26/2024) for CPE (fasting).     Alysia Penna, NP

## 2023-07-27 NOTE — Assessment & Plan Note (Signed)
Repeat vit. D °

## 2023-07-28 LAB — HEPATITIS B CORE AB W/REFLEX: Hep B Core Total Ab: NEGATIVE

## 2023-07-28 MED ORDER — VITAMIN D (ERGOCALCIFEROL) 1.25 MG (50000 UNIT) PO CAPS: 50000.0000 [IU] | ORAL_CAPSULE | ORAL | 0 refills | Status: AC

## 2023-07-28 NOTE — Addendum Note (Signed)
Addended by: Michaela Corner on: 07/28/2023 03:43 PM   Modules accepted: Orders

## 2023-07-29 LAB — RPR: RPR Ser Ql: NONREACTIVE

## 2023-07-29 LAB — HEPATITIS C ANTIBODY: Hepatitis C Ab: NONREACTIVE

## 2023-07-29 LAB — IRON,TIBC AND FERRITIN PANEL
%SAT: 43 % (ref 16–45)
Ferritin: 16 ng/mL (ref 16–154)
Iron: 169 ug/dL (ref 40–190)
TIBC: 396 ug/dL (ref 250–450)

## 2023-07-29 LAB — HIV ANTIBODY (ROUTINE TESTING W REFLEX): HIV 1&2 Ab, 4th Generation: NONREACTIVE

## 2023-08-12 ENCOUNTER — Encounter (HOSPITAL_COMMUNITY): Payer: Self-pay

## 2023-08-12 ENCOUNTER — Ambulatory Visit (HOSPITAL_COMMUNITY)
Admission: RE | Admit: 2023-08-12 | Discharge: 2023-08-12 | Disposition: A | Discharge status: Home / Self Care | Payer: Medicaid Other | Source: Ambulatory Visit | Attending: Family Medicine | Admitting: Family Medicine

## 2023-08-12 VITALS — BP 128/84 | HR 73 | Temp 98.4°F | Resp 15

## 2023-08-12 DIAGNOSIS — N898 Other specified noninflammatory disorders of vagina: Secondary | ICD-10-CM | POA: Insufficient documentation

## 2023-08-12 LAB — POCT URINE PREGNANCY: Preg Test, Ur: NEGATIVE

## 2023-08-12 NOTE — ED Triage Notes (Signed)
Pt was treated for BV earlier this month. Reports having smell again and wants testing again.

## 2023-08-13 LAB — CERVICOVAGINAL ANCILLARY ONLY
Bacterial Vaginitis (gardnerella): POSITIVE — AB
Candida Glabrata: NEGATIVE
Candida Vaginitis: NEGATIVE
Chlamydia: NEGATIVE
Comment: NEGATIVE
Comment: NEGATIVE
Comment: NEGATIVE
Comment: NEGATIVE
Comment: NEGATIVE
Comment: NORMAL
Neisseria Gonorrhea: NEGATIVE
Trichomonas: NEGATIVE

## 2023-08-13 NOTE — ED Provider Notes (Signed)
[facility]   [id number] 08/12/23 Arrival Time: 1750  ASSESSMENT & PLAN:  1. Vaginal odor    Vaginal cytology pending.  Without s/s of PID. UPT negative.  Labs Reviewed  POCT URINE PREGNANCY  CERVICOVAGINAL ANCILLARY ONLY   Will notify of any positive results. Instructed to refrain from sexual activity for at least seven days.  Reviewed expectations re: course of current medical issues. Questions answered. Outlined signs and symptoms indicating need for more acute intervention. Patient verbalized understanding. After Visit Summary given.   SUBJECTIVE:  Melissa Farmer is a 27 y.o. female who presents with complaint of vaginal discharge. Pt was treated for BV earlier this month. Reports having smell again and wants testing again.    Patient's last menstrual period was 08/03/2023.   OBJECTIVE:  Vitals:   08/12/23 1836  BP: 128/84  Pulse: 73  Resp: 15  Temp: 98.4 F (36.9 C)  TempSrc: Oral  SpO2: 98%     General appearance: alert, cooperative, appears stated age and no distress Lungs: unlabored respirations; speaks full sentences without difficulty Back: no CVA tenderness; FROM at waist Abdomen: soft, non-tender GU: deferred Skin: warm and dry Psychological: alert and cooperative; normal mood and affect.  Results for orders placed or performed during the hospital encounter of 08/12/23  POCT urine pregnancy  Result Value Ref Range   Preg Test, Ur Negative Negative    Labs Reviewed  POCT URINE PREGNANCY  CERVICOVAGINAL ANCILLARY ONLY    No Known Allergies  Past Medical History:  Diagnosis Date   Anemia 2019   Anxiety 05/21   Encounter for initial prescription of contraceptive pills 06/30/2017   Medical history non-contributory    Family History  Problem Relation Age of Onset   Asthma Father    Stroke Father    Diabetes Paternal Uncle    Kidney disease Maternal Grandmother    Asthma Maternal Grandmother    Stroke Maternal  Grandfather    Diabetes Maternal Grandfather    Heart disease Maternal Grandfather    Cancer Paternal Grandmother 75       breast   Asthma Paternal Grandmother    Social History   Socioeconomic History   Marital status: Significant Other    Spouse name: Not on file   Number of children: 2   Years of education: Not on file   Highest education level: Some college, no degree  Occupational History   Not on file  Tobacco Use   Smoking status: Never   Smokeless tobacco: Never  Vaping Use   Vaping status: Never Used  Substance and Sexual Activity   Alcohol use: Yes    Alcohol/week: 3.0 standard drinks of alcohol    Types: 3 Shots of liquor per week    Comment: occ   Drug use: No   Sexual activity: Yes    Birth control/protection: Patch  Other Topics Concern   Not on file  Social History Narrative   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   Social Determinants of Health   Financial Resource Strain: Low Risk  (06/23/2023)   Overall Financial Resource Strain (CARDIA)    Difficulty of Paying Living Expenses: Not hard at all  Food Insecurity: No Food Insecurity (06/23/2023)   Hunger Vital Sign    Worried About Running Out of Food in the Last Year: Never true    Ran Out of Food in the Last Year: Never true  Transportation Needs: No Transportation Needs (06/23/2023)   PRAPARE -  Administrator, Civil Service (Medical): No    Lack of Transportation (Non-Medical): No  Physical Activity: Insufficiently Active (06/23/2023)   Exercise Vital Sign    Days of Exercise per Week: 1 day    Minutes of Exercise per Session: 30 min  Stress: No Stress Concern Present (06/23/2023)   [employer] of Occupational Health - Occupational Stress Questionnaire    Feeling of Stress : Only a little  Social Connections: Moderately Isolated (06/23/2023)   Social Connection and Isolation Panel [NHANES]    Frequency of Communication with Friends and Family: More than three times a  week    Frequency of Social Gatherings with Friends and Family: More than three times a week    Attends Religious Services: More than 4 times per year    Active Member of [employer] or Organizations: No    Attends Banker Meetings: Not on file    Marital Status: Never married  Intimate Partner Violence: Not At Risk (09/21/2018)   Humiliation, Afraid, Rape, and Kick questionnaire    Fear of Current or Ex-Partner: No    Emotionally Abused: No    Physically Abused: No    Sexually Abused: No           Mardella Layman, MD 08/13/23 [phone]

## 2023-08-14 ENCOUNTER — Telehealth: Payer: Self-pay

## 2023-08-14 MED ORDER — METRONIDAZOLE 0.75 % VA GEL: 1.0000 | Freq: Every day | VAGINAL | 0 refills | Status: AC

## 2023-08-14 NOTE — Telephone Encounter (Signed)
Per protocol, pt requires tx with metronidazole. Reviewed with patient, verified pharmacy, prescription sent.

## 2023-08-21 ENCOUNTER — Ambulatory Visit
Admission: RE | Admit: 2023-08-21 | Discharge: 2023-08-21 | Disposition: A | Discharge status: Home / Self Care | Payer: Medicaid Other | Source: Ambulatory Visit | Attending: Internal Medicine | Admitting: Internal Medicine

## 2023-08-21 VITALS — BP 130/83 | HR 59 | Temp 98.8°F | Resp 16

## 2023-08-21 DIAGNOSIS — R300 Dysuria: Secondary | ICD-10-CM | POA: Diagnosis present

## 2023-08-21 LAB — POCT URINALYSIS DIP (MANUAL ENTRY)
Bilirubin, UA: NEGATIVE
Blood, UA: NEGATIVE
Glucose, UA: NEGATIVE mg/dL
Ketones, POC UA: NEGATIVE mg/dL
Leukocytes, UA: NEGATIVE
Nitrite, UA: NEGATIVE
Protein Ur, POC: NEGATIVE mg/dL
Spec Grav, UA: 1.025 (ref 1.010–1.025)
Urobilinogen, UA: 0.2 U/dL
pH, UA: 7 (ref 5.0–8.0)

## 2023-08-21 LAB — POCT URINE PREGNANCY: Preg Test, Ur: NEGATIVE

## 2023-08-21 NOTE — ED Provider Notes (Signed)
[facility]    CSN: [id number] Arrival date & time: 08/21/23  1717      History   Chief Complaint Chief Complaint  Patient presents with   Possible Pregnancy    Entered by patient    HPI Melissa Farmer is a 27 y.o. female presents for 2 to 3 days of malodorous urine with dysuria.  She endorses some intermittent nausea and suprapubic pressure but denies any fevers, vaginal discharge or STD concern, flank pain.  States she was recently treated for BV last month with resolution of symptoms.  States her last menstrual period was mid September and she is on birth control.  She just wants to make sure she is not pregnant and does not have a UTI.  No other concerns at this time.   Possible Pregnancy    Past Medical History:  Diagnosis Date   Anemia 2019   Anxiety 05/21   Encounter for initial prescription of contraceptive pills 06/30/2017   Medical history non-contributory     Patient Active Problem List   Diagnosis Date Noted   Iron deficiency anemia due to chronic blood loss 06/26/2020   Vitamin D deficiency 06/26/2020   Closed nondisplaced fracture of styloid process of left ulna 06/20/2020   Anxiety 05/23/2020   Left wrist tendonitis 05/22/2020   Encounter for initial prescription of contraceptive pills 06/30/2017   Excessive menstruation with irregular cycle 06/27/2016   Headache 03/26/2016    Past Surgical History:  Procedure Laterality Date   NO PAST SURGERIES      OB History     Gravida  2   Para  2   Term  2   Preterm      AB      Living  2      SAB      IAB      Ectopic      Multiple  0   Live Births  2            Home Medications    Prior to Admission medications   Medication Sig Start Date End Date Taking? Authorizing Provider  cetirizine (ZYRTEC ALLERGY) 10 MG tablet Take 1 tablet (10 mg total) by mouth daily. 11/04/22   Wallis Bamberg, PA-C  ferrous sulfate 325 (65 FE) MG EC tablet Take 325 mg by mouth 3  (three) times daily with meals.    [provider]  fluconazole (DIFLUCAN) 150 MG tablet Take 1 tablet (150 mg total) by mouth once a week. 07/23/23   Wallis Bamberg, PA-C  ibuprofen (ADVIL) 600 MG tablet Take 1 tablet (600 mg total) by mouth every 6 (six) hours as needed for moderate pain or cramping. 04/10/22   Banga, Sharol Given, DO  metroNIDAZOLE (FLAGYL) 500 MG tablet Take 1 tablet (500 mg total) by mouth 2 (two) times daily. 07/23/23   Wallis Bamberg, PA-C  Vitamin D, Ergocalciferol, (DRISDOL) 1.25 MG (50000 UNIT) CAPS capsule Take 1 capsule (50,000 Units total) by mouth every 7 (seven) days. 07/28/23   Nche, Bonna Gains, NP  ZAFEMY 150-35 MCG/24HR transdermal patch 1 patch once a week. 05/12/22   [provider]    Family History Family History  Problem Relation Age of Onset   Asthma Father    Stroke Father    Diabetes Paternal Uncle    Kidney disease Maternal Grandmother    Asthma Maternal Grandmother    Stroke Maternal Grandfather    Diabetes Maternal Grandfather    Heart disease Maternal  Grandfather    Cancer Paternal Grandmother 25       breast   Asthma Paternal Grandmother     Social History Social History   Tobacco Use   Smoking status: Never   Smokeless tobacco: Never  Vaping Use   Vaping status: Never Used  Substance Use Topics   Alcohol use: Yes    Alcohol/week: 3.0 standard drinks of alcohol    Types: 3 Shots of liquor per week    Comment: occ   Drug use: No     Allergies   Patient has no known allergies.   Review of Systems Review of Systems  Gastrointestinal:  Positive for nausea.  Genitourinary:  Positive for dysuria.     Physical Exam Triage Vital Signs ED Triage Vitals  Encounter Vitals Group     BP 08/21/23 1738 130/83     Systolic BP Percentile --      Diastolic BP Percentile --      Pulse Rate 08/21/23 1738 (!) 59     Resp 08/21/23 1738 16     Temp 08/21/23 1738 98.8 F (37.1 C)     Temp Source 08/21/23 1738 Oral      SpO2 08/21/23 1738 99 %     Weight --      Height --      Head Circumference --      Peak Flow --      Pain Score 08/21/23 1741 2     Pain Loc --      Pain Education --      Exclude from Growth Chart --    No data found.  Updated Vital Signs BP 130/83 (BP Location: Left Arm)   Pulse (!) 59   Temp 98.8 F (37.1 C) (Oral)   Resp 16   LMP 08/03/2023   SpO2 99%   Visual Acuity Right Eye Distance:   Left Eye Distance:   Bilateral Distance:    Right Eye Near:   Left Eye Near:    Bilateral Near:     Physical Exam Vitals and nursing note reviewed.  Constitutional:      Appearance: Normal appearance.  HENT:     Head: Normocephalic and atraumatic.  Eyes:     Pupils: Pupils are equal, round, and reactive to light.  Cardiovascular:     Rate and Rhythm: Normal rate.  Pulmonary:     Effort: Pulmonary effort is normal.  Abdominal:     Tenderness: There is no right CVA tenderness or left CVA tenderness.  Skin:    General: Skin is warm and dry.  Neurological:     General: No focal deficit present.     Mental Status: She is alert and oriented to person, place, and time.  Psychiatric:        Mood and Affect: Mood normal.        Behavior: Behavior normal.      UC Treatments / Results  Labs (all labs ordered are listed, but only abnormal results are displayed) Labs Reviewed  URINE CULTURE  POCT URINE PREGNANCY  POCT URINALYSIS DIP (MANUAL ENTRY)    EKG   Radiology No results found.  Procedures Procedures (including critical care time)  Medications Ordered in UC Medications - No data to display  Initial Impression / Assessment and Plan / UC Course  I have reviewed the triage vital signs and the nursing notes.  Pertinent labs & imaging results that were available during my care of the patient were reviewed by  me and considered in my medical decision making (see chart for details).     Reviewed exam and symptoms with patient.  No red flags.  Negative urine  pregnancy and negative UA.  Will culture given symptoms and contact for any positive results.  Patient declined any STD testing at this time.  Discussed increasing fluids and rest.  Advised PCP follow-up if symptoms do not improve.  ER precautions reviewed. Final Clinical Impressions(s) / UC Diagnoses   Final diagnoses:  Dysuria   Discharge Instructions   None    ED Prescriptions   None    PDMP not reviewed this encounter.   Radford Pax, NP 08/21/23 1759

## 2023-08-21 NOTE — ED Triage Notes (Signed)
Pt presents to UC for pregnancy test. Lower abd pain, nausea, strong odor to urine x2-3 days.

## 2023-08-21 NOTE — Discharge Instructions (Addendum)
The clinic will contact you with results of the urine culture done today if positive.  Lots of rest and fluids.  Please follow-up with your PCP if your symptoms do not improve.  Please go to the ER for any worsening symptoms.  I do feel better soon!

## 2023-08-23 LAB — URINE CULTURE: Culture: NO GROWTH

## 2023-08-31 ENCOUNTER — Encounter (HOSPITAL_COMMUNITY): Payer: Self-pay

## 2023-08-31 ENCOUNTER — Ambulatory Visit (HOSPITAL_COMMUNITY)
Admission: RE | Admit: 2023-08-31 | Discharge: 2023-08-31 | Disposition: A | Discharge status: Home / Self Care | Payer: Medicaid Other | Source: Ambulatory Visit | Attending: Emergency Medicine | Admitting: Emergency Medicine

## 2023-08-31 VITALS — BP 126/73 | HR 70 | Temp 98.7°F | Resp 16

## 2023-08-31 DIAGNOSIS — Z789 Other specified health status: Secondary | ICD-10-CM

## 2023-08-31 DIAGNOSIS — N3001 Acute cystitis with hematuria: Secondary | ICD-10-CM | POA: Diagnosis not present

## 2023-08-31 LAB — POCT URINALYSIS DIP (MANUAL ENTRY)
Glucose, UA: NEGATIVE mg/dL
Ketones, POC UA: NEGATIVE mg/dL
Nitrite, UA: NEGATIVE
Protein Ur, POC: 30 mg/dL — AB
Spec Grav, UA: 1.025 (ref 1.010–1.025)
Urobilinogen, UA: 0.2 U/dL
pH, UA: 7.5 (ref 5.0–8.0)

## 2023-08-31 LAB — POCT URINE PREGNANCY: Preg Test, Ur: NEGATIVE

## 2023-08-31 MED ORDER — CIPROFLOXACIN HCL 250 MG PO TABS
250.0000 mg | ORAL_TABLET | Freq: Two times a day (BID) | ORAL | 0 refills | Status: AC
Start: 2023-08-31 — End: 2023-09-05

## 2023-08-31 NOTE — ED Triage Notes (Signed)
Pt c/o tingling feeling when urinating, urinary frequency, and small amounts of blood in urine that started last night.  Pt also wants to have pregnancy test

## 2023-08-31 NOTE — ED Provider Notes (Signed)
[facility]    CSN: [id number] Arrival date & time: 08/31/23  1524    HISTORY   Chief Complaint  Patient presents with   Urinary Frequency    Entered by patient   HPI Melissa Farmer is a pleasant, 27 y.o. female who presents to urgent care today. Patient complains of tingling feeling when urinating, urinary frequency, and small amounts of blood in urine that started last night.  Patient also requests pregnancy testing.  Patient denies abnormal vaginal discharge, vaginal itching, vaginal irritation, sensation of incomplete emptying, flank pain, fever , body aches, chills, rigors, malaise, significant fatigue, nausea, vomiting, diarrhea, and constipation.   Past Medical History:  Diagnosis Date   Anemia 2019   Anxiety 05/21   Encounter for initial prescription of contraceptive pills 06/30/2017   Medical history non-contributory    Patient Active Problem List   Diagnosis Date Noted   Iron deficiency anemia due to chronic blood loss 06/26/2020   Vitamin D deficiency 06/26/2020   Closed nondisplaced fracture of styloid process of left ulna 06/20/2020   Anxiety 05/23/2020   Left wrist tendonitis 05/22/2020   Encounter for initial prescription of contraceptive pills 06/30/2017   Excessive menstruation with irregular cycle 06/27/2016   Headache 03/26/2016   Past Surgical History:  Procedure Laterality Date   NO PAST SURGERIES     OB History     Gravida  2   Para  2   Term  2   Preterm      AB      Living  2      SAB      IAB      Ectopic      Multiple  0   Live Births  2          Home Medications    Prior to Admission medications   Medication Sig Start Date End Date Taking? Authorizing Provider  ferrous sulfate 325 (65 FE) MG EC tablet Take 325 mg by mouth 3 (three) times daily with meals.    [provider]  ibuprofen (ADVIL) 600 MG tablet Take 1 tablet (600 mg total) by mouth every 6 (six) hours as needed for moderate  pain or cramping. 04/10/22   Banga, Sharol Given, DO  Vitamin D, Ergocalciferol, (DRISDOL) 1.25 MG (50000 UNIT) CAPS capsule Take 1 capsule (50,000 Units total) by mouth every 7 (seven) days. 07/28/23   Nche, Bonna Gains, NP  ZAFEMY 150-35 MCG/24HR transdermal patch 1 patch once a week. 05/12/22   [provider]    Family History Family History  Problem Relation Age of Onset   Asthma Father    Stroke Father    Diabetes Paternal Uncle    Kidney disease Maternal Grandmother    Asthma Maternal Grandmother    Stroke Maternal Grandfather    Diabetes Maternal Grandfather    Heart disease Maternal Grandfather    Cancer Paternal Grandmother 23       breast   Asthma Paternal Grandmother    Social History Social History   Tobacco Use   Smoking status: Never   Smokeless tobacco: Never  Vaping Use   Vaping status: Never Used  Substance Use Topics   Alcohol use: Yes    Alcohol/week: 3.0 standard drinks of alcohol    Types: 3 Shots of liquor per week    Comment: occ   Drug use: No   Allergies   Patient has no known allergies.  Review of Systems Review of Systems  Pertinent findings revealed after performing a 14 point review of systems has been noted in the history of present illness.  Physical Exam Vital Signs BP 126/73 (BP Location: Left Arm)   Pulse 70   Temp 98.7 F (37.1 C) (Oral)   Resp 16   LMP 08/03/2023 (Approximate)   SpO2 99%   No data found.  Physical Exam  Visual Acuity Right Eye Distance:   Left Eye Distance:   Bilateral Distance:    Right Eye Near:   Left Eye Near:    Bilateral Near:     UC Couse / Diagnostics / Procedures:     Radiology No results found.  Procedures Procedures (including critical care time) EKG  Pending results:  Labs Reviewed  POCT URINALYSIS DIP (MANUAL ENTRY) - Abnormal; Notable for the following components:      Result Value   Color, UA brown (*)    Clarity, UA cloudy (*)    Bilirubin, UA small (*)     Blood, UA large (*)    Protein Ur, POC =30 (*)    Leukocytes, UA Moderate (2+) (*)    All other components within normal limits  POCT URINE PREGNANCY    Medications Ordered in UC: Medications - No data to display  UC Diagnoses / Final Clinical Impressions(s)   I have reviewed the triage vital signs and the nursing notes.  Pertinent labs & imaging results that were available during my care of the patient were reviewed by me and considered in my medical decision making (see chart for details).    Final diagnoses:  Acute cystitis with hematuria  Not currently pregnant   Urine dip today was positive.   Patient was advised to begin antibiotics now due to findings on urine dip. Patient was advised to begin antibiotics today due to having active symptoms of urinary tract infection.     Urine pregnancy test today was negative.                Return precautions advised.  Please see discharge instructions below for details of plan of care as provided to patient. ED Prescriptions     Medication Sig Dispense Auth. Provider   ciprofloxacin (CIPRO) 250 MG tablet Take 1 tablet (250 mg total) by mouth 2 (two) times daily for 5 days. 10 tablet Theadora Rama Scales, PA-C      PDMP not reviewed this encounter.  Disposition Upon Discharge:  Condition: stable for discharge home  Patient presented with concern for an acute illness with associated systemic symptoms and significant discomfort requiring urgent management. In my opinion, this is a condition that a prudent lay person (someone who possesses an average knowledge of health and medicine) may potentially expect to result in complications if not addressed urgently such as respiratory distress, impairment of bodily function or dysfunction of bodily organs.   As such, the patient has been evaluated and assessed, work-up was performed and treatment was provided in alignment with urgent care protocols and evidence based medicine.   Patient/parent/caregiver has been advised that the patient may require follow up for further testing and/or treatment if the symptoms continue in spite of treatment, as clinically indicated and appropriate.  Routine symptom specific, illness specific and/or disease specific instructions were discussed with the patient and/or caregiver at length.  Prevention strategies for avoiding STD exposure were also discussed.  The patient will follow up with their current PCP if and as advised. If the patient does not currently have a PCP we will  assist them in obtaining one.   The patient may need specialty follow up if the symptoms continue, in spite of conservative treatment and management, for further workup, evaluation, consultation and treatment as clinically indicated and appropriate.  Patient/parent/caregiver verbalized understanding and agreement of plan as discussed.  All questions were addressed during visit.  Please see discharge instructions below for further details of plan.  Discharge Instructions:   Discharge Instructions      Common causes of urinary tract infections include but are not limited to holding your urine longer than you should, squatting instead of sitting down when urinating, sitting around in wet clothing such as a wet swimsuit or gym clothes too long, not emptying your bladder after having sexual intercourse, wiping from back to front instead of front to back after having a bowel movement.     The urinalysis that we performed in the clinic today was abnormal.     You were advised to begin antibiotics today because your urinalysis is abnormal and you are having active symptoms of an acute lower urinary tract infection also known as cystitis.     Please pick up and begin taking your prescription for Ciprofloxacin 250 mg as soon as possible.  Please take all doses exactly as prescribed.  You can take this medication with or without food.  This medication is safe to take with  your other medications.  Your urine pregnancy test today was negative.   Please consider abstaining from sexual intercourse while you are being treated for urinary tract infection.   If you have not had complete resolution of your symptoms after completing treatment as prescribed, please return to urgent care for repeat evaluation or follow-up with your primary care provider.  Repeat urinalysis and urine culture may be indicated for more directed therapy.   Thank you for visiting [facility] today.  We appreciate the opportunity to participate in your care.       This office note has been dictated using Teaching laboratory technician.  Unfortunately, this method of dictation can sometimes lead to typographical or grammatical errors.  I apologize for your inconvenience in advance if this occurs.  Please do not hesitate to reach out to me if clarification is needed.       Theadora Rama Scales, PA-C 08/31/23 [phone]

## 2023-08-31 NOTE — Discharge Instructions (Signed)
Common causes of urinary tract infections include but are not limited to holding your urine longer than you should, squatting instead of sitting down when urinating, sitting around in wet clothing such as a wet swimsuit or gym clothes too long, not emptying your bladder after having sexual intercourse, wiping from back to front instead of front to back after having a bowel movement.     The urinalysis that we performed in the clinic today was abnormal.     You were advised to begin antibiotics today because your urinalysis is abnormal and you are having active symptoms of an acute lower urinary tract infection also known as cystitis.     Please pick up and begin taking your prescription for Ciprofloxacin 250 mg as soon as possible.  Please take all doses exactly as prescribed.  You can take this medication with or without food.  This medication is safe to take with your other medications.  Your urine pregnancy test today was negative.   Please consider abstaining from sexual intercourse while you are being treated for urinary tract infection.   If you have not had complete resolution of your symptoms after completing treatment as prescribed, please return to urgent care for repeat evaluation or follow-up with your primary care provider.  Repeat urinalysis and urine culture may be indicated for more directed therapy.   Thank you for visiting [facility] today.  We appreciate the opportunity to participate in your care.

## 2023-09-24 ENCOUNTER — Encounter (HOSPITAL_COMMUNITY): Payer: Self-pay

## 2023-09-24 ENCOUNTER — Ambulatory Visit (HOSPITAL_COMMUNITY)
Admission: RE | Admit: 2023-09-24 | Discharge: 2023-09-24 | Disposition: A | Discharge status: Home / Self Care | Payer: Medicaid Other | Source: Ambulatory Visit | Attending: Family Medicine | Admitting: Family Medicine

## 2023-09-24 VITALS — BP 111/77 | HR 81 | Temp 98.7°F | Resp 16

## 2023-09-24 DIAGNOSIS — Z113 Encounter for screening for infections with a predominantly sexual mode of transmission: Secondary | ICD-10-CM | POA: Diagnosis present

## 2023-09-24 DIAGNOSIS — M25561 Pain in right knee: Secondary | ICD-10-CM | POA: Diagnosis present

## 2023-09-24 DIAGNOSIS — M25562 Pain in left knee: Secondary | ICD-10-CM | POA: Diagnosis present

## 2023-09-24 NOTE — ED Triage Notes (Signed)
Pt states she would like regular STI testing.    She complain of bilateral knee pain, she does run a lot but has had increased pain X 2 days. She has been using icey hot.

## 2023-09-24 NOTE — Discharge Instructions (Signed)
You were seen today for various issues.  Your std testing will be resulted tomorrow.  You will see these results on mychart, and if anything is positive you will be notified.  Your bilateral knee pain is likely due to over use.  I recommend you ice the knees, and use anti-inflammatories for pain.  Please follow up with your primary care provider or sports medicine if you continue with pain.

## 2023-09-24 NOTE — ED Provider Notes (Signed)
[facility]    CSN: [id number] Arrival date & time: 09/24/23  1256      History   Chief Complaint Chief Complaint  Patient presents with   SEXUALLY TRANSMITTED DISEASE    Entered by patient   Knee Pain    HPI Melissa Farmer is a 27 y.o. female.    Knee Pain  Patient is here for several issues.  She has a new partner and would like STD testing.  No symptoms and no known exposures.  She also has bilateral medial knee pain.  She is a runner, and recently had pain after running.  No swelling.  No injury.  Has not used any otc meds for this.       Past Medical History:  Diagnosis Date   Anemia 2019   Anxiety 05/21   Encounter for initial prescription of contraceptive pills 06/30/2017   Medical history non-contributory     Patient Active Problem List   Diagnosis Date Noted   Iron deficiency anemia due to chronic blood loss 06/26/2020   Vitamin D deficiency 06/26/2020   Closed nondisplaced fracture of styloid process of left ulna 06/20/2020   Anxiety 05/23/2020   Left wrist tendonitis 05/22/2020   Encounter for initial prescription of contraceptive pills 06/30/2017   Excessive menstruation with irregular cycle 06/27/2016   Headache 03/26/2016    Past Surgical History:  Procedure Laterality Date   NO PAST SURGERIES      OB History     Gravida  2   Para  2   Term  2   Preterm      AB      Living  2      SAB      IAB      Ectopic      Multiple  0   Live Births  2            Home Medications    Prior to Admission medications   Medication Sig Start Date End Date Taking? Authorizing Provider  ferrous sulfate 325 (65 FE) MG EC tablet Take 325 mg by mouth 3 (three) times daily with meals.   Yes [provider]  Vitamin D, Ergocalciferol, (DRISDOL) 1.25 MG (50000 UNIT) CAPS capsule Take 1 capsule (50,000 Units total) by mouth every 7 (seven) days. 07/28/23  Yes Nche, Bonna Gains, NP  ZAFEMY 150-35 MCG/24HR  transdermal patch 1 patch once a week. 05/12/22  Yes [provider]  ibuprofen (ADVIL) 600 MG tablet Take 1 tablet (600 mg total) by mouth every 6 (six) hours as needed for moderate pain or cramping. 04/10/22   Banga, Sharol Given, DO    Family History Family History  Problem Relation Age of Onset   Asthma Father    Stroke Father    Diabetes Paternal Uncle    Kidney disease Maternal Grandmother    Asthma Maternal Grandmother    Stroke Maternal Grandfather    Diabetes Maternal Grandfather    Heart disease Maternal Grandfather    Cancer Paternal Grandmother 9       breast   Asthma Paternal Grandmother     Social History Social History   Tobacco Use   Smoking status: Never   Smokeless tobacco: Never  Vaping Use   Vaping status: Never Used  Substance Use Topics   Alcohol use: Yes    Alcohol/week: 3.0 standard drinks of alcohol    Types: 3 Shots of liquor per week    Comment: occ  Drug use: No     Allergies   Patient has no known allergies.   Review of Systems Review of Systems  Constitutional: Negative.   HENT: Negative.    Respiratory: Negative.    Cardiovascular: Negative.   Gastrointestinal: Negative.   Musculoskeletal:  Positive for arthralgias.  Psychiatric/Behavioral: Negative.       Physical Exam Triage Vital Signs ED Triage Vitals  Encounter Vitals Group     BP 09/24/23 1314 111/77     Systolic BP Percentile --      Diastolic BP Percentile --      Pulse Rate 09/24/23 1314 81     Resp 09/24/23 1314 16     Temp 09/24/23 1314 98.7 F (37.1 C)     Temp Source 09/24/23 1314 Oral     SpO2 09/24/23 1314 99 %     Weight --      Height --      Head Circumference --      Peak Flow --      Pain Score 09/24/23 1313 8     Pain Loc --      Pain Education --      Exclude from Growth Chart --    No data found.  Updated Vital Signs BP 111/77 (BP Location: Right Arm)   Pulse 81   Temp 98.7 F (37.1 C) (Oral)   Resp 16   LMP 09/07/2023  (Exact Date)   SpO2 99%   Visual Acuity Right Eye Distance:   Left Eye Distance:   Bilateral Distance:    Right Eye Near:   Left Eye Near:    Bilateral Near:     Physical Exam Constitutional:      Appearance: Normal appearance.  Musculoskeletal:     Comments: No obvious swelling to the knees.  She has TTP to the medial knees bilaterally, left greater than right.  Full rom with some pain with full extension;   Neurological:     Mental Status: She is alert.      UC Treatments / Results  Labs (all labs ordered are listed, but only abnormal results are displayed) Labs Reviewed  CERVICOVAGINAL ANCILLARY ONLY    EKG   Radiology No results found.  Procedures Procedures (including critical care time)  Medications Ordered in UC Medications - No data to display  Initial Impression / Assessment and Plan / UC Course  I have reviewed the triage vital signs and the nursing notes.  Pertinent labs & imaging results that were available during my care of the patient were reviewed by me and considered in my medical decision making (see chart for details).   Final Clinical Impressions(s) / UC Diagnoses   Final diagnoses:  Screening for STD (sexually transmitted disease)  Acute pain of both knees     Discharge Instructions      You were seen today for various issues.  Your std testing will be resulted tomorrow.  You will see these results on mychart, and if anything is positive you will be notified.  Your bilateral knee pain is likely due to over use.  I recommend you ice the knees, and use anti-inflammatories for pain.  Please follow up with your primary care provider or sports medicine if you continue with pain.     ED Prescriptions   None    PDMP not reviewed this encounter.   Jannifer Franklin, MD 09/24/23 1359

## 2023-09-25 ENCOUNTER — Encounter: Payer: Self-pay | Admitting: Nurse Practitioner

## 2023-09-25 DIAGNOSIS — S52615S Nondisplaced fracture of left ulna styloid process, sequela: Secondary | ICD-10-CM

## 2023-09-25 LAB — CERVICOVAGINAL ANCILLARY ONLY
Bacterial Vaginitis (gardnerella): POSITIVE — AB
Candida Glabrata: NEGATIVE
Candida Vaginitis: NEGATIVE
Chlamydia: NEGATIVE
Comment: NEGATIVE
Comment: NEGATIVE
Comment: NEGATIVE
Comment: NEGATIVE
Comment: NEGATIVE
Comment: NORMAL
Neisseria Gonorrhea: NEGATIVE
Trichomonas: NEGATIVE

## 2023-09-29 NOTE — Assessment & Plan Note (Signed)
Injury occurred in 06/2020. She was evaluated by hand specialist. Last OV 07/2020. Referred to OT and advised to return for repeat x-ray. This did not occur Today she is requesting for letter stating fracture has healed. Advised to go for repeat wrist x-ray.

## 2023-09-29 NOTE — Telephone Encounter (Signed)
Please see the MyChart message reply(ies) for my assessment and plan.  The patient gave consent for this Medical Advice Message and is aware that it may result in a bill to their insurance company as well as the possibility that this may result in a co-payment or deductible. They are an established patient, but are not seeking medical advice exclusively about a problem treated during an in person or video visit in the last 7 days. I did not recommend an in person or video visit within 7 days of my reply.  I spent a total of 15 minutes cumulative time within 7 days through [place], NP

## 2023-10-07 ENCOUNTER — Telehealth: Payer: Self-pay | Admitting: Nurse Practitioner

## 2023-10-07 ENCOUNTER — Ambulatory Visit: Payer: Medicaid Other | Admitting: Nurse Practitioner

## 2023-10-07 NOTE — Telephone Encounter (Signed)
1st no show, letter sent via [facility]

## 2023-10-07 NOTE — Telephone Encounter (Signed)
11.20.24 no show /medicaid

## 2023-10-21 ENCOUNTER — Ambulatory Visit: Payer: Medicaid Other | Admitting: Nurse Practitioner

## 2023-10-21 ENCOUNTER — Telehealth: Payer: Self-pay | Admitting: Nurse Practitioner

## 2023-10-21 NOTE — Telephone Encounter (Signed)
2nd missed visit, pt arrived at 8:19am and rescheduled, final warning sent via mail and mychart

## 2023-10-21 NOTE — Telephone Encounter (Signed)
Pt was a no show for an OV with [place] on 10/21/23.

## 2023-10-23 ENCOUNTER — Ambulatory Visit
Admission: EM | Admit: 2023-10-23 | Discharge: 2023-10-23 | Disposition: A | Discharge status: Home / Self Care | Payer: Medicaid Other | Attending: Family Medicine | Admitting: Family Medicine

## 2023-10-23 DIAGNOSIS — B9789 Other viral agents as the cause of diseases classified elsewhere: Secondary | ICD-10-CM | POA: Diagnosis present

## 2023-10-23 DIAGNOSIS — N760 Acute vaginitis: Secondary | ICD-10-CM | POA: Diagnosis present

## 2023-10-23 DIAGNOSIS — B9689 Other specified bacterial agents as the cause of diseases classified elsewhere: Secondary | ICD-10-CM | POA: Insufficient documentation

## 2023-10-23 DIAGNOSIS — R070 Pain in throat: Secondary | ICD-10-CM | POA: Diagnosis present

## 2023-10-23 DIAGNOSIS — J988 Other specified respiratory disorders: Secondary | ICD-10-CM | POA: Insufficient documentation

## 2023-10-23 LAB — POCT RAPID STREP A (OFFICE): Rapid Strep A Screen: NEGATIVE

## 2023-10-23 MED ORDER — METRONIDAZOLE 500 MG PO TABS: 500.0000 mg | ORAL_TABLET | Freq: Two times a day (BID) | ORAL | 0 refills | Status: DC

## 2023-10-23 MED ORDER — PSEUDOEPHEDRINE HCL 60 MG PO TABS: 60.0000 mg | ORAL_TABLET | Freq: Three times a day (TID) | ORAL | 0 refills | Status: DC | PRN

## 2023-10-23 MED ORDER — CETIRIZINE HCL 10 MG PO TABS: 10.0000 mg | ORAL_TABLET | Freq: Every day | ORAL | 0 refills | Status: DC

## 2023-10-23 MED ORDER — IBUPROFEN 600 MG PO TABS: 600.0000 mg | ORAL_TABLET | Freq: Four times a day (QID) | ORAL | 0 refills | Status: DC | PRN

## 2023-10-23 NOTE — ED Triage Notes (Signed)
Pt reports ear pain, chills and sore throat since last night. Pt has not taken any meds for complaints.

## 2023-10-23 NOTE — ED Provider Notes (Signed)
[facility]  Note:  This document was prepared using Conservation officer, historic buildings and may include unintentional dictation errors.  MRN: [id number] DOB: July 22, 1996  Subjective:   Melissa Farmer is a 27 y.o. female presenting for 1 day history of throat pain, painful swallowing, bilateral ear pain, chills, sinus headache.  Has not tried any medications for relief.  No cough, chest pain, shortness of breath or wheezing.  Patient would also like a recheck of bacterial vaginosis.  She has started to have a very strong malodor in the vaginal area again.  Has a history of BV infection.  She has started to have a bit more vaginal discharge but is not excessive as before.  No urinary symptoms.  Patient is on contraception, has low concern for pregnancy.  No current facility-administered medications for this encounter.  Current Outpatient Medications:    ferrous sulfate 325 (65 FE) MG EC tablet, Take 325 mg by mouth 3 (three) times daily with meals., Disp: , Rfl:    ibuprofen (ADVIL) 600 MG tablet, Take 1 tablet (600 mg total) by mouth every 6 (six) hours as needed for moderate pain or cramping., Disp: 40 tablet, Rfl: 1   Vitamin D, Ergocalciferol, (DRISDOL) 1.25 MG (50000 UNIT) CAPS capsule, Take 1 capsule (50,000 Units total) by mouth every 7 (seven) days., Disp: 12 capsule, Rfl: 0   ZAFEMY 150-35 MCG/24HR transdermal patch, 1 patch once a week., Disp: , Rfl:    No Known Allergies  Past Medical History:  Diagnosis Date   Anemia 2019   Anxiety 05/21   Encounter for initial prescription of contraceptive pills 06/30/2017   Medical history non-contributory      Past Surgical History:  Procedure Laterality Date   NO PAST SURGERIES      Family History  Problem Relation Age of Onset   Asthma Father    Stroke Father    Diabetes Paternal Uncle    Kidney disease Maternal Grandmother    Asthma Maternal Grandmother    Stroke Maternal Grandfather    Diabetes  Maternal Grandfather    Heart disease Maternal Grandfather    Cancer Paternal Grandmother 72       breast   Asthma Paternal Grandmother     Social History   Tobacco Use   Smoking status: Never   Smokeless tobacco: Never  Vaping Use   Vaping status: Never Used  Substance Use Topics   Alcohol use: Yes    Alcohol/week: 3.0 standard drinks of alcohol    Types: 3 Shots of liquor per week    Comment: occ   Drug use: No    ROS   Objective:   Vitals: BP 122/80 (BP Location: Right Arm)   Pulse 90   Temp 99.4 F (37.4 C) (Oral)   Resp 16   LMP 09/12/2023 (Exact Date)   SpO2 99%   Breastfeeding No   Physical Exam Constitutional:      General: She is not in acute distress.    Appearance: Normal appearance. She is well-developed and normal weight. She is not ill-appearing, toxic-appearing or diaphoretic.  HENT:     Head: Normocephalic and atraumatic.     Right Ear: Tympanic membrane, ear canal and external ear normal. No drainage or tenderness. No middle ear effusion. There is no impacted cerumen. Tympanic membrane is not erythematous or bulging.     Left Ear: Tympanic membrane, ear canal and external ear normal. No drainage or tenderness.  No middle ear effusion.  There is no impacted cerumen. Tympanic membrane is not erythematous or bulging.     Nose: Nose normal. No congestion or rhinorrhea.     Mouth/Throat:     Mouth: Mucous membranes are moist. No oral lesions.     Pharynx: No pharyngeal swelling, oropharyngeal exudate, posterior oropharyngeal erythema or uvula swelling.     Tonsils: No tonsillar exudate or tonsillar abscesses.  Eyes:     General: No scleral icterus.       Right eye: No discharge.        Left eye: No discharge.     Extraocular Movements: Extraocular movements intact.     Right eye: Normal extraocular motion.     Left eye: Normal extraocular motion.     Conjunctiva/sclera: Conjunctivae normal.  Cardiovascular:     Rate and Rhythm: Normal rate.   Pulmonary:     Effort: Pulmonary effort is normal.  Musculoskeletal:     Cervical back: Normal range of motion and neck supple.  Lymphadenopathy:     Cervical: No cervical adenopathy.  Skin:    General: Skin is warm and dry.  Neurological:     General: No focal deficit present.     Mental Status: She is alert and oriented to person, place, and time.  Psychiatric:        Mood and Affect: Mood normal.        Behavior: Behavior normal.     Results for orders placed or performed during the hospital encounter of 10/23/23 (from the past 24 hour(s))  POCT rapid strep A     Status: None   Collection Time: 10/23/23  5:41 PM  Result Value Ref Range   Rapid Strep A Screen Negative Negative    Assessment and Plan :   PDMP not reviewed this encounter.  1. Viral respiratory infection   2. Bacterial vaginosis   3. Throat pain    Strep culture pending.  Will manage with supportive care for a viral respiratory infection.  Will treat empirically for bacterial vaginosis, labs pending.  Follow-up with OB/GYN.  Counseled patient on potential for adverse effects with medications prescribed/recommended today, ER and return-to-clinic precautions discussed, patient verbalized understanding.    Wallis Bamberg, [place] 10/23/23 1749

## 2023-10-23 NOTE — Discharge Instructions (Addendum)
We will manage this as a viral respiratory illness. For sore throat or cough try using a honey-based tea. Use 3 teaspoons of honey with juice squeezed from half lemon. Place shaved pieces of ginger into 1/2-1 cup of water and warm over stove top. Then mix the ingredients and repeat every 4 hours as needed. Please take ibuprofen 600mg  every 6 hours with food alternating with OR taken together with Tylenol 500mg -650mg  every 6 hours for throat pain, fevers, aches and pains. Hydrate very well with at least 2 liters of water. Eat light meals such as soups (chicken and noodles, vegetable, chicken and wild rice).  Do not eat foods that you are allergic to.  Taking an antihistamine like Zyrtec (10mg  daily) can help against postnasal drainage, sinus congestion which can cause sinus pain, sinus headaches, throat pain, painful swallowing, coughing.  You can take this together with pseudoephedrine (Sudafed) at a dose of 60mg  3 times a day or twice daily as needed for the same kind of nasal drip, congestion.    Restart metronidazole for suspected recurring BV infection.

## 2023-10-26 LAB — CERVICOVAGINAL ANCILLARY ONLY
Bacterial Vaginitis (gardnerella): POSITIVE — AB
Candida Glabrata: NEGATIVE
Candida Vaginitis: NEGATIVE
Chlamydia: NEGATIVE
Comment: NEGATIVE
Comment: NEGATIVE
Comment: NEGATIVE
Comment: NEGATIVE
Comment: NEGATIVE
Comment: NORMAL
Neisseria Gonorrhea: NEGATIVE
Trichomonas: NEGATIVE

## 2023-10-26 LAB — CULTURE, GROUP A STREP (THRC)

## 2023-10-29 ENCOUNTER — Ambulatory Visit: Payer: Medicaid Other | Admitting: Nurse Practitioner

## 2023-10-29 ENCOUNTER — Telehealth: Payer: Self-pay | Admitting: Nurse Practitioner

## 2023-10-29 NOTE — Telephone Encounter (Signed)
People called out of work she had to go in.

## 2023-10-30 NOTE — Telephone Encounter (Signed)
10/07/2023 no show 10/21/2023 late arrival, sent final warning 10/27/2023 same day cancel/work  Pt has rescheduled for 11/25/2023. Do you want to dismiss and see pt 11/25/2023 as last visit?

## 2023-11-02 NOTE — Telephone Encounter (Addendum)
Dismissal generated, have provider sent for end date 11/26/2023

## 2023-11-25 ENCOUNTER — Ambulatory Visit: Payer: Medicaid Other | Admitting: Nurse Practitioner

## 2023-12-07 ENCOUNTER — Ambulatory Visit (HOSPITAL_COMMUNITY)
Admission: RE | Admit: 2023-12-07 | Discharge: 2023-12-07 | Disposition: A | Discharge status: Home / Self Care | Payer: Medicaid Other | Source: Ambulatory Visit | Attending: Family Medicine | Admitting: Family Medicine

## 2023-12-07 ENCOUNTER — Encounter (HOSPITAL_COMMUNITY): Payer: Self-pay

## 2023-12-07 VITALS — BP 123/81 | HR 98 | Temp 98.1°F | Resp 16

## 2023-12-07 DIAGNOSIS — Z202 Contact with and (suspected) exposure to infections with a predominantly sexual mode of transmission: Secondary | ICD-10-CM | POA: Insufficient documentation

## 2023-12-07 DIAGNOSIS — N898 Other specified noninflammatory disorders of vagina: Secondary | ICD-10-CM | POA: Insufficient documentation

## 2023-12-07 LAB — HIV ANTIBODY (ROUTINE TESTING W REFLEX): HIV Screen 4th Generation wRfx: NONREACTIVE

## 2023-12-07 LAB — POCT URINE PREGNANCY: Preg Test, Ur: NEGATIVE

## 2023-12-07 NOTE — ED Provider Notes (Signed)
[facility]    CSN: [id number] Arrival date & time: 12/07/23  1454      History   Chief Complaint Chief Complaint  Patient presents with   SEXUALLY TRANSMITTED DISEASE   Vaginal Itching    HPI Melissa Farmer is a 28 y.o. female.    Vaginal Itching  Here for some vaginal itching that is been bothering her off and on since yesterday.  It is not very persistent.  No tummy pain and no vomiting and no dysuria.  No vaginal discharge.  NKDA  LMP was 11/20/2023    Past Medical History:  Diagnosis Date   Anemia 2019   Anxiety 05/21   Encounter for initial prescription of contraceptive pills 06/30/2017   Medical history non-contributory     Patient Active Problem List   Diagnosis Date Noted   Iron deficiency anemia due to chronic blood loss 06/26/2020   Vitamin D deficiency 06/26/2020   Closed nondisplaced fracture of styloid process of left ulna 06/20/2020   Anxiety 05/23/2020   Left wrist tendonitis 05/22/2020   Encounter for initial prescription of contraceptive pills 06/30/2017   Excessive menstruation with irregular cycle 06/27/2016   Headache 03/26/2016    Past Surgical History:  Procedure Laterality Date   NO PAST SURGERIES      OB History     Gravida  2   Para  2   Term  2   Preterm      AB      Living  2      SAB      IAB      Ectopic      Multiple  0   Live Births  2            Home Medications    Prior to Admission medications   Medication Sig Start Date End Date Taking? Authorizing Provider  ferrous sulfate 325 (65 FE) MG EC tablet Take 325 mg by mouth 3 (three) times daily with meals.   Yes [provider]  Vitamin D, Ergocalciferol, (DRISDOL) 1.25 MG (50000 UNIT) CAPS capsule Take 1 capsule (50,000 Units total) by mouth every 7 (seven) days. 07/28/23  Yes Nche, Bonna Gains, NP  ZAFEMY 150-35 MCG/24HR transdermal patch 1 patch once a week. 05/12/22  Yes [provider]  cetirizine  (ZYRTEC ALLERGY) 10 MG tablet Take 1 tablet (10 mg total) by mouth daily. 10/23/23   Wallis Bamberg, PA-C  pseudoephedrine (SUDAFED) 60 MG tablet Take 1 tablet (60 mg total) by mouth every 8 (eight) hours as needed for congestion. 10/23/23   Wallis Bamberg, PA-C    Family History Family History  Problem Relation Age of Onset   Asthma Father    Stroke Father    Diabetes Paternal Uncle    Kidney disease Maternal Grandmother    Asthma Maternal Grandmother    Stroke Maternal Grandfather    Diabetes Maternal Grandfather    Heart disease Maternal Grandfather    Cancer Paternal Grandmother 49       breast   Asthma Paternal Grandmother     Social History Social History   Tobacco Use   Smoking status: Never   Smokeless tobacco: Never  Vaping Use   Vaping status: Never Used  Substance Use Topics   Alcohol use: Yes    Alcohol/week: 3.0 standard drinks of alcohol    Types: 3 Shots of liquor per week    Comment: occ   Drug use: No  Allergies   Patient has no known allergies.   Review of Systems Review of Systems   Physical Exam Triage Vital Signs ED Triage Vitals  Encounter Vitals Group     BP 12/07/23 1515 123/81     Systolic BP Percentile --      Diastolic BP Percentile --      Pulse Rate 12/07/23 1515 98     Resp 12/07/23 1515 16     Temp 12/07/23 1515 98.1 F (36.7 C)     Temp Source 12/07/23 1515 Oral     SpO2 12/07/23 1515 98 %     Weight --      Height --      Head Circumference --      Peak Flow --      Pain Score 12/07/23 1514 0     Pain Loc --      Pain Education --      Exclude from Growth Chart --    No data found.  Updated Vital Signs BP 123/81 (BP Location: Left Arm)   Pulse 98   Temp 98.1 F (36.7 C) (Oral)   Resp 16   LMP 11/20/2023 (Exact Date)   SpO2 98%   Visual Acuity Right Eye Distance:   Left Eye Distance:   Bilateral Distance:    Right Eye Near:   Left Eye Near:    Bilateral Near:     Physical Exam Vitals reviewed.   Constitutional:      General: She is not in acute distress.    Appearance: She is not ill-appearing, toxic-appearing or diaphoretic.  Skin:    Coloration: Skin is not pale.  Neurological:     Mental Status: She is alert and oriented to person, place, and time.  Psychiatric:        Behavior: Behavior normal.      UC Treatments / Results  Labs (all labs ordered are listed, but only abnormal results are displayed) Labs Reviewed  POCT URINE PREGNANCY - Normal  RPR  HIV ANTIBODY (ROUTINE TESTING W REFLEX)  CERVICOVAGINAL ANCILLARY ONLY    EKG   Radiology No results found.  Procedures Procedures (including critical care time)  Medications Ordered in UC Medications - No data to display  Initial Impression / Assessment and Plan / UC Course  I have reviewed the triage vital signs and the nursing notes.  Pertinent labs & imaging results that were available during my care of the patient were reviewed by me and considered in my medical decision making (see chart for details).     Blood is drawn to check HIV and RPR. Vaginal self swab is done, and we will notify of any positives on that or on the blood work, and treat per protocol. We discussed empiric treatment and decided against it. Final Clinical Impressions(s) / UC Diagnoses   Final diagnoses:  Vaginal itching  Exposure to STD     Discharge Instructions      Your pregnancy test was negative.  Staff will notify you if there is anything positive on the swab or on the bloodwork.     ED Prescriptions   None    PDMP not reviewed this encounter.   Zenia Resides, MD 12/07/23 [phone]

## 2023-12-07 NOTE — Discharge Instructions (Signed)
Your pregnancy test was negative.  Staff will notify you if there is anything positive on the swab or on the bloodwork.

## 2023-12-07 NOTE — ED Triage Notes (Signed)
Pt states she has had some vaginal itching on and off since yesterday. She would like STI testing blood work, cyto and urine preg.

## 2023-12-08 LAB — CERVICOVAGINAL ANCILLARY ONLY
Bacterial Vaginitis (gardnerella): NEGATIVE
Candida Glabrata: NEGATIVE
Candida Vaginitis: NEGATIVE
Chlamydia: NEGATIVE
Comment: NEGATIVE
Comment: NEGATIVE
Comment: NEGATIVE
Comment: NEGATIVE
Comment: NEGATIVE
Comment: NORMAL
Neisseria Gonorrhea: NEGATIVE
Trichomonas: NEGATIVE

## 2023-12-08 LAB — RPR: RPR Ser Ql: NONREACTIVE

## 2024-01-05 ENCOUNTER — Ambulatory Visit (HOSPITAL_COMMUNITY)
Admission: RE | Admit: 2024-01-05 | Discharge: 2024-01-05 | Disposition: A | Discharge status: Home / Self Care | Payer: Medicaid Other | Source: Ambulatory Visit | Attending: Emergency Medicine | Admitting: Emergency Medicine

## 2024-01-05 ENCOUNTER — Encounter (HOSPITAL_COMMUNITY): Payer: Self-pay

## 2024-01-05 ENCOUNTER — Other Ambulatory Visit: Payer: Self-pay

## 2024-01-05 VITALS — BP 105/70 | HR 97 | Temp 98.5°F | Resp 16

## 2024-01-05 DIAGNOSIS — J019 Acute sinusitis, unspecified: Secondary | ICD-10-CM | POA: Insufficient documentation

## 2024-01-05 DIAGNOSIS — B9689 Other specified bacterial agents as the cause of diseases classified elsewhere: Secondary | ICD-10-CM

## 2024-01-05 DIAGNOSIS — Z789 Other specified health status: Secondary | ICD-10-CM | POA: Diagnosis not present

## 2024-01-05 DIAGNOSIS — Z113 Encounter for screening for infections with a predominantly sexual mode of transmission: Secondary | ICD-10-CM

## 2024-01-05 DIAGNOSIS — N926 Irregular menstruation, unspecified: Secondary | ICD-10-CM | POA: Diagnosis present

## 2024-01-05 LAB — POCT URINALYSIS DIP (MANUAL ENTRY)
Glucose, UA: NEGATIVE mg/dL
Leukocytes, UA: NEGATIVE
Nitrite, UA: NEGATIVE
Protein Ur, POC: 30 mg/dL — AB
Spec Grav, UA: 1.02 (ref 1.010–1.025)
Urobilinogen, UA: 0.2 U/dL
pH, UA: 6 (ref 5.0–8.0)

## 2024-01-05 LAB — POCT URINE PREGNANCY: Preg Test, Ur: NEGATIVE

## 2024-01-05 MED ORDER — FLUTICASONE PROPIONATE 50 MCG/ACT NA SUSP
1.0000 | Freq: Every day | NASAL | 2 refills | Status: DC
Start: 2024-01-05 — End: 2024-01-11

## 2024-01-05 MED ORDER — CETIRIZINE HCL 10 MG PO TABS: 10.0000 mg | ORAL_TABLET | Freq: Every day | ORAL | 2 refills | Status: AC

## 2024-01-05 MED ORDER — AMOXICILLIN-POT CLAVULANATE 875-125 MG PO TABS
1.0000 | ORAL_TABLET | Freq: Two times a day (BID) | ORAL | 0 refills | Status: AC
Start: 2024-01-05 — End: 2024-01-12

## 2024-01-05 NOTE — ED Triage Notes (Signed)
 Patient reports vaginal bleeding reoccurred this month-2 periods.. std testing  Patient recently had the flu-left eye feels pressure and nose is running.    Patient is not taking any medicine

## 2024-01-05 NOTE — ED Provider Notes (Addendum)
 [facility]    CSN: [id number] Arrival date & time: 01/05/24  1148    HISTORY   Chief Complaint  Patient presents with   SEXUALLY TRANSMITTED DISEASE    Entered by patient   Appointment    11:30   HPI Melissa Farmer is a pleasant, 28 y.o. female who presents to urgent care today. Patient states she has had 2 menstrual periods in the past month, states they were separated by about a week and both were equally heavy in flow.  Patient denies excessive abdominal pain, abdominal cramping.  Patient currently using patch for birth control.    Pt is requesting routine STD testing. Patient denies abnormal vaginal discharge, vaginal itching, vaginal irritation, genital lesion , burning during urination, increased frequency of urination, and known STD exposure  Patient states she recently had influenza felt better for about a week ago but then began to feel pressure behind her left eye and in her left ear a few days ago.  Patient denies fever, body aches, chills, dizziness, loss of taste or smell, loss of hearing.  Patient denies known sick contacts.  Patient states he is not sure anything to alleviate her symptoms.  The history is provided by the patient.    Past Medical History:  Diagnosis Date   Anemia 2019   Anxiety 05/21   Encounter for initial prescription of contraceptive pills 06/30/2017   Medical history non-contributory    Patient Active Problem List   Diagnosis Date Noted   Iron deficiency anemia due to chronic blood loss 06/26/2020   Vitamin D deficiency 06/26/2020   Closed nondisplaced fracture of styloid process of left ulna 06/20/2020   Anxiety 05/23/2020   Left wrist tendonitis 05/22/2020   Encounter for initial prescription of contraceptive pills 06/30/2017   Excessive menstruation with irregular cycle 06/27/2016   Headache 03/26/2016   Past Surgical History:  Procedure Laterality Date   NO PAST SURGERIES     OB History     Gravida  2    Para  2   Term  2   Preterm      AB      Living  2      SAB      IAB      Ectopic      Multiple  0   Live Births  2          Home Medications    Prior to Admission medications   Medication Sig Start Date End Date Taking? Authorizing Provider  cetirizine (ZYRTEC ALLERGY) 10 MG tablet Take 1 tablet (10 mg total) by mouth daily. Patient not taking: Reported on 01/05/2024 10/23/23   Wallis Bamberg, PA-C  ferrous sulfate 325 (65 FE) MG EC tablet Take 325 mg by mouth 3 (three) times daily with meals.    [provider]  pseudoephedrine (SUDAFED) 60 MG tablet Take 1 tablet (60 mg total) by mouth every 8 (eight) hours as needed for congestion. Patient not taking: Reported on 01/05/2024 10/23/23   Wallis Bamberg, PA-C  Vitamin D, Ergocalciferol, (DRISDOL) 1.25 MG (50000 UNIT) CAPS capsule Take 1 capsule (50,000 Units total) by mouth every 7 (seven) days. 07/28/23   Nche, Bonna Gains, NP  ZAFEMY 150-35 MCG/24HR transdermal patch 1 patch once a week. 05/12/22   [provider]    Family History Family History  Problem Relation Age of Onset   Asthma Father    Stroke Father    Diabetes Paternal Uncle  Kidney disease Maternal Grandmother    Asthma Maternal Grandmother    Stroke Maternal Grandfather    Diabetes Maternal Grandfather    Heart disease Maternal Grandfather    Cancer Paternal Grandmother 21       breast   Asthma Paternal Grandmother    Social History Social History   Tobacco Use   Smoking status: Never   Smokeless tobacco: Never  Vaping Use   Vaping status: Never Used  Substance Use Topics   Alcohol use: Yes    Alcohol/week: 3.0 standard drinks of alcohol    Types: 3 Shots of liquor per week    Comment: occ   Drug use: No   Allergies   Patient has no known allergies.  Review of Systems Review of Systems Pertinent findings revealed after performing a 14 point review of systems has been noted in the history of present illness.  Physical  Exam Vital Signs BP 105/70 (BP Location: Left Arm)   Pulse 97   Temp 98.5 F (36.9 C) (Oral)   Resp 16   LMP 12/21/2023 (Exact Date)   SpO2 98%   No data found.  Physical Exam Vitals and nursing note reviewed.  Constitutional:      General: She is awake. She is not in acute distress.    Appearance: Normal appearance. She is well-developed and well-groomed. She is not ill-appearing.  HENT:     Head: Normocephalic and atraumatic.     Salivary Glands: Right salivary gland is not diffusely enlarged or tender. Left salivary gland is not diffusely enlarged or tender.     Right Ear: Hearing, tympanic membrane, ear canal and external ear normal. No drainage. No middle ear effusion. There is no impacted cerumen. Tympanic membrane is not erythematous or bulging.     Left Ear: Hearing, ear canal and external ear normal. No drainage. A middle ear effusion is present. There is no impacted cerumen. Tympanic membrane is injected. Tympanic membrane is not erythematous or bulging.     Nose: No nasal deformity, septal deviation, mucosal edema, congestion or rhinorrhea.     Right Turbinates: Not enlarged, swollen or pale.     Left Turbinates: Enlarged and swollen. Not pale.     Right Sinus: No maxillary sinus tenderness or frontal sinus tenderness.     Left Sinus: Maxillary sinus tenderness and frontal sinus tenderness present.     Mouth/Throat:     Lips: Pink. No lesions.     Mouth: Mucous membranes are moist. No oral lesions.     Pharynx: Oropharynx is clear. Uvula midline. Postnasal drip (purulent) present. No pharyngeal swelling, oropharyngeal exudate, posterior oropharyngeal erythema or uvula swelling.     Tonsils: No tonsillar exudate. 0 on the right. 0 on the left.  Eyes:     General: Lids are normal.        Right eye: No discharge.        Left eye: No discharge.     Extraocular Movements: Extraocular movements intact.     Conjunctiva/sclera: Conjunctivae normal.     Right eye: Right  conjunctiva is not injected.     Left eye: Left conjunctiva is not injected.  Neck:     Trachea: Trachea and phonation normal.  Cardiovascular:     Rate and Rhythm: Normal rate and regular rhythm.     Pulses: Normal pulses.     Heart sounds: Normal heart sounds. No murmur heard.    No friction rub. No gallop.  Pulmonary:  Effort: Pulmonary effort is normal. No accessory muscle usage, prolonged expiration or respiratory distress.     Breath sounds: Normal breath sounds. No stridor, decreased air movement or transmitted upper airway sounds. No decreased breath sounds, wheezing, rhonchi or rales.  Chest:     Chest wall: No tenderness.  Genitourinary:    Comments: Patient politely declines pelvic exam today, patient provided a vaginal swab for testing. Musculoskeletal:        General: Normal range of motion.     Cervical back: Normal range of motion and neck supple. Normal range of motion.  Lymphadenopathy:     Cervical: No cervical adenopathy.  Skin:    General: Skin is warm and dry.     Findings: No erythema or rash.  Neurological:     General: No focal deficit present.     Mental Status: She is alert and oriented to person, place, and time.  Psychiatric:        Mood and Affect: Mood normal.        Behavior: Behavior normal. Behavior is cooperative.     Visual Acuity Right Eye Distance:   Left Eye Distance:   Bilateral Distance:    Right Eye Near:   Left Eye Near:    Bilateral Near:     [facility] Couse / Diagnostics / Procedures:     Radiology No results found.  Procedures Procedures (including critical care time) EKG  Pending results:  Labs Reviewed  POCT URINALYSIS DIP (MANUAL ENTRY) - Abnormal; Notable for the following components:      Result Value   Clarity, UA hazy (*)    Bilirubin, UA small (*)    Ketones, POC UA trace (5) (*)    Blood, UA moderate (*)    Protein Ur, POC =30 (*)    All other components within normal limits  POCT URINE PREGNANCY - Normal   CERVICOVAGINAL ANCILLARY ONLY    Medications Ordered in UC: Medications - No data to display  UC Diagnoses / Final Clinical Impressions(s)   I have reviewed the triage vital signs and the nursing notes.  Pertinent labs & imaging results that were available during my care of the patient were reviewed by me and considered in my medical decision making (see chart for details).    Final diagnoses:  Acute bacterial sinusitis  Screening examination for STD (sexually transmitted disease)  Menstrual periods irregular  Not currently pregnant  Patient advised to follow-up with gynecology if irregular periods persist for 3 months.  STD screening was performed, patient advised that the results be posted to their MyChart and if any of the results are positive, they will be notified by phone, further treatment will be provided as indicated based on results of STD screening. Patient was advised to abstain from sexual intercourse until that they receive the results of their STD testing.  Patient was also advised to use condoms to protect themselves from STD exposure. Urinalysis today was normal. Urine pregnancy test was negative.  Will treat patient empirically for presumed bacterial infection of left maxillary sinus with a 7-day course of Augmentin.  I have resumed patient's prescriptions for antihistamine and nasal steroid spray for relief of symptoms.  Recommend ibuprofen to relieve pain and pressure.  Conservative care recommended.  Return precautions advised.  Please see discharge instructions below for details of plan of care as provided to patient. ED Prescriptions     Medication Sig Dispense Auth. Provider   cetirizine (ZYRTEC ALLERGY) 10 MG tablet Take 1  tablet (10 mg total) by mouth at bedtime. 30 tablet Theadora Rama Scales, PA-C   fluticasone (FLONASE) 50 MCG/ACT nasal spray Place 1 spray into both nostrils daily. Begin by using 2 sprays in each nare daily for 3 to 5 days, then decrease  to 1 spray in each nare daily. 15.8 mL Theadora Rama Scales, PA-C   amoxicillin-clavulanate (AUGMENTIN) 875-125 MG tablet Take 1 tablet by mouth 2 (two) times daily for 7 days. 14 tablet Theadora Rama Scales, PA-C      PDMP not reviewed this encounter.  Disposition Upon Discharge:  Condition: stable for discharge home  Patient presented with concern for an acute illness with associated systemic symptoms and significant discomfort requiring urgent management. In my opinion, this is a condition that a prudent lay person (someone who possesses an average knowledge of health and medicine) may potentially expect to result in complications if not addressed urgently such as respiratory distress, impairment of bodily function or dysfunction of bodily organs.   As such, the patient has been evaluated and assessed, work-up was performed and treatment was provided in alignment with urgent care protocols and evidence based medicine.  Patient/parent/caregiver has been advised that the patient may require follow up for further testing and/or treatment if the symptoms continue in spite of treatment, as clinically indicated and appropriate.  Routine symptom specific, illness specific and/or disease specific instructions were discussed with the patient and/or caregiver at length.  Prevention strategies for avoiding STD exposure were also discussed.  The patient will follow up with their current PCP if and as advised. If the patient does not currently have a PCP we will assist them in obtaining one.   The patient may need specialty follow up if the symptoms continue, in spite of conservative treatment and management, for further workup, evaluation, consultation and treatment as clinically indicated and appropriate.  Patient/parent/caregiver verbalized understanding and agreement of plan as discussed.  All questions were addressed during visit.  Please see discharge instructions below for further details of  plan.    Discharge Instructions      Please read below to learn more about the medications, dosages and frequencies that I recommend to help alleviate your symptoms and to get you feeling better soon:   Augmentin (amoxicillin - clavulanic acid):  Please take one (1) dose twice daily for 7 days.  This antibiotic can cause upset stomach, this will resolve once antibiotics are complete.  You are welcome to take a probiotic, eat yogurt, take Imodium while taking this medication.  Please avoid other systemic medications such as Maalox, Pepto-Bismol or milk of magnesia as they can interfere with the body's ability to absorb the antibiotics.    Advil, Motrin (ibuprofen): This is a good anti-inflammatory medication which not only addresses aches, pains but also significantly reduces soft tissue inflammation of the upper airways that causes sinus and nasal congestion as well as inflammation of the lower airways which makes you feel like your breathing is constricted or your cough feel tight.  I recommend that you take 400 mg every 8 hours as needed.      Zyrtec (cetirizine): This is an excellent second-generation antihistamine that helps to reduce respiratory inflammatory response to environmental allergens.  In some patients, this medication can cause daytime sleepiness so I recommend that you take 1 tablet daily at bedtime.     Flonase (fluticasone): This is a steroid nasal spray that used once daily, 1 spray in each nare.  This works best when used on  a daily basis. This medication does not work well if it is only used when you think you need it.  After 3 to 5 days of use, you will notice significant reduction of the inflammation and mucus production that is currently being caused by exposure to allergens, whether seasonal or environmental.  The most common side effect of this medication is nosebleeds.  If you experience a nosebleed, please discontinue use for 1 week, then feel free to resume.  If you find  that your insurance will not pay for this medication, please consider a different nasal steroids such as Nasonex (mometasone), or Nasacort (triamcinolone).   If symptoms have not meaningfully improved in the next 5 to 7 days, please return for repeat evaluation or follow-up with your regular provider.  If symptoms have worsened in the next 3 to 5 days, please go to the emergency room for further evaluation.    If you continue to have irregular menstrual periods for the next 2 to 3 months, please consider following up with your gynecologist for further evaluation.  The results of your vaginal swab test which screens for BV, yeast, gonorrhea, chlamydia and trichomonas will be posted to your MyChart account once it is complete.  This typically takes 2 to 4 days.  Please abstain from sexual intercourse of any kind, vaginal, oral or anal, until you have received the results of your STD testing.      If any of your results are abnormal, you will receive a phone call regarding treatment.  Prescriptions, if any are needed, will be provided for you at your pharmacy.    Thank you for visiting urgent care today.  We appreciate the opportunity to participate in your care.       This office note has been dictated using Teaching laboratory technician.  Unfortunately, this method of dictation can sometimes lead to typographical or grammatical errors.  I apologize for your inconvenience in advance if this occurs.  Please do not hesitate to reach out to me if clarification is needed.       Theadora Rama Scales, PA-C 01/05/24 1403    Theadora Rama Scales, PA-C 01/05/24 [phone]

## 2024-01-05 NOTE — Discharge Instructions (Addendum)
 Please read below to learn more about the medications, dosages and frequencies that I recommend to help alleviate your symptoms and to get you feeling better soon:   Augmentin (amoxicillin - clavulanic acid):  Please take one (1) dose twice daily for 7 days.  This antibiotic can cause upset stomach, this will resolve once antibiotics are complete.  You are welcome to take a probiotic, eat yogurt, take Imodium while taking this medication.  Please avoid other systemic medications such as Maalox, Pepto-Bismol or milk of magnesia as they can interfere with the body's ability to absorb the antibiotics.    Advil, Motrin (ibuprofen): This is a good anti-inflammatory medication which not only addresses aches, pains but also significantly reduces soft tissue inflammation of the upper airways that causes sinus and nasal congestion as well as inflammation of the lower airways which makes you feel like your breathing is constricted or your cough feel tight.  I recommend that you take 400 mg every 8 hours as needed.      Zyrtec (cetirizine): This is an excellent second-generation antihistamine that helps to reduce respiratory inflammatory response to environmental allergens.  In some patients, this medication can cause daytime sleepiness so I recommend that you take 1 tablet daily at bedtime.     Flonase (fluticasone): This is a steroid nasal spray that used once daily, 1 spray in each nare.  This works best when used on a daily basis. This medication does not work well if it is only used when you think you need it.  After 3 to 5 days of use, you will notice significant reduction of the inflammation and mucus production that is currently being caused by exposure to allergens, whether seasonal or environmental.  The most common side effect of this medication is nosebleeds.  If you experience a nosebleed, please discontinue use for 1 week, then feel free to resume.  If you find that your insurance will not pay for this  medication, please consider a different nasal steroids such as Nasonex (mometasone), or Nasacort (triamcinolone).   If symptoms have not meaningfully improved in the next 5 to 7 days, please return for repeat evaluation or follow-up with your regular provider.  If symptoms have worsened in the next 3 to 5 days, please go to the emergency room for further evaluation.    If you continue to have irregular menstrual periods for the next 2 to 3 months, please consider following up with your gynecologist for further evaluation.  The results of your vaginal swab test which screens for BV, yeast, gonorrhea, chlamydia and trichomonas will be posted to your MyChart account once it is complete.  This typically takes 2 to 4 days.  Please abstain from sexual intercourse of any kind, vaginal, oral or anal, until you have received the results of your STD testing.      If any of your results are abnormal, you will receive a phone call regarding treatment.  Prescriptions, if any are needed, will be provided for you at your pharmacy.    Thank you for visiting urgent care today.  We appreciate the opportunity to participate in your care.

## 2024-01-05 NOTE — ED Triage Notes (Signed)
 Lmp was 2/3.  Then started bleeding again yesterday.  Patient is not using contraceptive patches as instructed

## 2024-01-06 ENCOUNTER — Telehealth: Payer: Self-pay

## 2024-01-06 LAB — CERVICOVAGINAL ANCILLARY ONLY
Bacterial Vaginitis (gardnerella): POSITIVE — AB
Candida Glabrata: NEGATIVE
Candida Vaginitis: NEGATIVE
Chlamydia: NEGATIVE
Comment: NEGATIVE
Comment: NEGATIVE
Comment: NEGATIVE
Comment: NEGATIVE
Comment: NEGATIVE
Comment: NORMAL
Neisseria Gonorrhea: NEGATIVE
Trichomonas: NEGATIVE

## 2024-01-06 MED ORDER — METRONIDAZOLE 500 MG PO TABS: 500.0000 mg | ORAL_TABLET | Freq: Two times a day (BID) | ORAL | 0 refills | Status: AC

## 2024-01-11 ENCOUNTER — Ambulatory Visit
Admission: EM | Admit: 2024-01-11 | Discharge: 2024-01-11 | Disposition: A | Discharge status: Home / Self Care | Payer: Medicaid Other | Attending: Family Medicine | Admitting: Family Medicine

## 2024-01-11 DIAGNOSIS — R519 Headache, unspecified: Secondary | ICD-10-CM | POA: Diagnosis not present

## 2024-01-11 DIAGNOSIS — J329 Chronic sinusitis, unspecified: Secondary | ICD-10-CM

## 2024-01-11 DIAGNOSIS — W503XXA Accidental bite by another person, initial encounter: Secondary | ICD-10-CM | POA: Diagnosis not present

## 2024-01-11 MED ORDER — PREDNISONE 10 MG PO TABS
30.0000 mg | ORAL_TABLET | Freq: Every day | ORAL | 0 refills | Status: AC
Start: 2024-01-11 — End: ?
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
## 2024-01-11 NOTE — Discharge Instructions (Signed)
 Keep taking Augmentin, cetirizine. Stop Flonase nasal spray. Start prednisone for the next 5 days.
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
## 2024-01-12 ENCOUNTER — Encounter (HOSPITAL_COMMUNITY): Payer: Self-pay | Admitting: Emergency Medicine

## 2024-01-12 ENCOUNTER — Emergency Department (HOSPITAL_COMMUNITY)
Admission: EM | Admit: 2024-01-12 | Discharge: 2024-01-12 | Disposition: A | Discharge status: Home / Self Care | Payer: Medicaid Other | Attending: Emergency Medicine | Admitting: Emergency Medicine

## 2024-01-12 ENCOUNTER — Other Ambulatory Visit: Payer: Self-pay

## 2024-01-12 DIAGNOSIS — M792 Neuralgia and neuritis, unspecified: Secondary | ICD-10-CM | POA: Diagnosis not present

## 2024-01-12 DIAGNOSIS — S1185XA Open bite of other specified part of neck, initial encounter: Secondary | ICD-10-CM | POA: Insufficient documentation

## 2024-01-12 DIAGNOSIS — M542 Cervicalgia: Secondary | ICD-10-CM | POA: Diagnosis present

## 2024-01-12 DIAGNOSIS — W503XXA Accidental bite by another person, initial encounter: Secondary | ICD-10-CM

## 2024-01-12 LAB — RESP PANEL BY RT-PCR (RSV, FLU A&B, COVID)  RVPGX2
Influenza A by PCR: NEGATIVE
Influenza B by PCR: NEGATIVE
Resp Syncytial Virus by PCR: NEGATIVE
SARS Coronavirus 2 by RT PCR: NEGATIVE

## 2024-01-12 MED ORDER — METHOCARBAMOL 500 MG PO TABS: 500.0000 mg | ORAL_TABLET | Freq: Three times a day (TID) | ORAL | 0 refills | Status: AC | PRN
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
## 2024-01-12 NOTE — Discharge Instructions (Signed)
 1.  Continue ibuprofen every 6-8 hours for pain control.  You may take Robaxin every 8 hours if needed for muscle relaxer.  Apply warm moist heat to area of tenderness. 2.  Follow-up with your primary care doctor for recheck in 5 to 7 days. 3.  Return for worsening or changing symptoms. 4.  Finish your previously prescribed antibiotics and medications for your sinus infection.
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
## 2024-02-09 ENCOUNTER — Other Ambulatory Visit (HOSPITAL_BASED_OUTPATIENT_CLINIC_OR_DEPARTMENT_OTHER): Payer: Self-pay | Admitting: Emergency Medicine

## 2024-07-26 ENCOUNTER — Ambulatory Visit (HOSPITAL_COMMUNITY)

## 2024-10-17 ENCOUNTER — Ambulatory Visit (HOSPITAL_COMMUNITY)

## 2025-01-25 ENCOUNTER — Encounter: Admitting: Obstetrics & Gynecology

## 2025-01-27 ENCOUNTER — Encounter: Payer: Self-pay | Admitting: Family
